# Patient Record
Sex: Female | Born: 1935 | Race: White | Hispanic: No | Smoking: Never smoker
Health system: Southern US, Community
[De-identification: ages and names within clinical notes are randomized; demographics above are authoritative.]

## PROBLEM LIST (undated history)

## (undated) DIAGNOSIS — Z9289 Personal history of other medical treatment: Secondary | ICD-10-CM

## (undated) DIAGNOSIS — F419 Anxiety disorder, unspecified: Secondary | ICD-10-CM

## (undated) DIAGNOSIS — I10 Essential (primary) hypertension: Secondary | ICD-10-CM

## (undated) HISTORY — DX: Personal history of other medical treatment: Z92.89

## (undated) HISTORY — DX: Anxiety disorder, unspecified: F41.9

## (undated) HISTORY — PX: CHOLECYSTECTOMY: SHX55

---

## 2015-04-18 DIAGNOSIS — Z23 Encounter for immunization: Secondary | ICD-10-CM | POA: Diagnosis not present

## 2015-09-20 DIAGNOSIS — B029 Zoster without complications: Secondary | ICD-10-CM | POA: Diagnosis not present

## 2015-12-05 ENCOUNTER — Emergency Department: Payer: Medicare Other

## 2015-12-05 ENCOUNTER — Emergency Department
Admission: EM | Admit: 2015-12-05 | Discharge: 2015-12-05 | Disposition: A | Discharge status: Home / Self Care | Payer: Medicare Other | Attending: Emergency Medicine | Admitting: Emergency Medicine

## 2015-12-05 ENCOUNTER — Encounter: Payer: Self-pay | Admitting: Emergency Medicine

## 2015-12-05 DIAGNOSIS — M25511 Pain in right shoulder: Secondary | ICD-10-CM | POA: Diagnosis not present

## 2015-12-05 DIAGNOSIS — M79601 Pain in right arm: Secondary | ICD-10-CM | POA: Diagnosis not present

## 2015-12-05 DIAGNOSIS — Z791 Long term (current) use of non-steroidal anti-inflammatories (NSAID): Secondary | ICD-10-CM | POA: Diagnosis not present

## 2015-12-05 DIAGNOSIS — Z7982 Long term (current) use of aspirin: Secondary | ICD-10-CM | POA: Insufficient documentation

## 2015-12-05 DIAGNOSIS — R9431 Abnormal electrocardiogram [ECG] [EKG]: Secondary | ICD-10-CM | POA: Diagnosis not present

## 2015-12-05 DIAGNOSIS — M79602 Pain in left arm: Secondary | ICD-10-CM | POA: Diagnosis not present

## 2015-12-05 DIAGNOSIS — M25512 Pain in left shoulder: Secondary | ICD-10-CM | POA: Diagnosis not present

## 2015-12-05 DIAGNOSIS — R6889 Other general symptoms and signs: Secondary | ICD-10-CM | POA: Diagnosis not present

## 2015-12-05 LAB — CBC
HEMATOCRIT: 41.1 % (ref 35.0–47.0)
HEMOGLOBIN: 14 g/dL (ref 12.0–16.0)
MCH: 32.2 pg (ref 26.0–34.0)
MCHC: 34.2 g/dL (ref 32.0–36.0)
MCV: 94.3 fL (ref 80.0–100.0)
Platelets: 291 10*3/uL (ref 150–440)
RBC: 4.36 MIL/uL (ref 3.80–5.20)
RDW: 14.1 % (ref 11.5–14.5)
WBC: 9.7 10*3/uL (ref 3.6–11.0)

## 2015-12-05 LAB — BASIC METABOLIC PANEL
ANION GAP: 6 (ref 5–15)
BUN: 15 mg/dL (ref 6–20)
CALCIUM: 8.9 mg/dL (ref 8.9–10.3)
CO2: 26 mmol/L (ref 22–32)
Chloride: 109 mmol/L (ref 101–111)
Creatinine, Ser: 1.05 mg/dL — ABNORMAL HIGH (ref 0.44–1.00)
GFR calc Af Amer: 55 mL/min — ABNORMAL LOW (ref >60)
GFR, EST NON AFRICAN AMERICAN: 48 mL/min — AB (ref >60)
Glucose, Bld: 96 mg/dL (ref 65–99)
POTASSIUM: 3.7 mmol/L (ref 3.5–5.1)
Sodium: 141 mmol/L (ref 135–145)

## 2015-12-05 LAB — TROPONIN I

## 2015-12-05 NOTE — Discharge Instructions (Signed)
Shoulder Pain The shoulder is the joint that connects your arms to your body. The bones that form the shoulder joint include the upper arm bone (humerus), the shoulder blade (scapula), and the collarbone (clavicle). The top of the humerus is shaped like a ball and fits into a rather flat socket on the scapula (glenoid cavity). A combination of muscles and strong, fibrous tissues that connect muscles to bones (tendons) support your shoulder joint and hold the ball in the socket. Small, fluid-filled sacs (bursae) are located in different areas of the joint. They act as cushions between the bones and the overlying soft tissues and help reduce friction between the gliding tendons and the bone as you move your arm. Your shoulder joint allows a wide range of motion in your arm. This range of motion allows you to do things like scratch your back or throw a ball. However, this range of motion also makes your shoulder more prone to pain from overuse and injury. Causes of shoulder pain can originate from both injury and overuse and usually can be grouped in the following four categories:  Redness, swelling, and pain (inflammation) of the tendon (tendinitis) or the bursae (bursitis).  Instability, such as a dislocation of the joint.  Inflammation of the joint (arthritis).  Broken bone (fracture). HOME CARE INSTRUCTIONS   Apply ice to the sore area.  Put ice in a plastic bag.  Place a towel between your skin and the bag.  Leave the ice on for 15-20 minutes, 3-4 times per day for the first 2 days, or as directed by your health care provider.  Stop using cold packs if they do not help with the pain.  If you have a shoulder sling or immobilizer, wear it as long as your caregiver instructs. Only remove it to shower or bathe. Move your arm as little as possible, but keep your hand moving to prevent swelling.  Squeeze a soft ball or foam pad as much as possible to help prevent swelling.  Only take  over-the-counter or prescription medicines for pain, discomfort, or fever as directed by your caregiver. SEEK MEDICAL CARE IF:   Your shoulder pain increases, or new pain develops in your arm, hand, or fingers.  Your hand or fingers become cold and numb.  Your pain is not relieved with medicines. SEEK IMMEDIATE MEDICAL CARE IF:   Your arm, hand, or fingers are numb or tingling.  Your arm, hand, or fingers are significantly swollen or turn white or blue. MAKE SURE YOU:   Understand these instructions.  Will watch your condition.  Will get help right away if you are not doing well or get worse.   This information is not intended to replace advice given to you by your health care provider. Make sure you discuss any questions you have with your health care provider.   Document Released: 02/27/2005 Document Revised: 06/10/2014 Document Reviewed: 09/12/2014 Elsevier Interactive Patient Education 2016 [employer]  Pain Without a Known Cause WHAT IS PAIN WITHOUT A KNOWN CAUSE? Pain can occur in any part of the body and can range from mild to severe. Sometimes no cause can be found for why you are having pain. Some types of pain that can occur without a known cause include:   Headache.  Back pain.  Abdominal pain.  Neck pain. HOW IS PAIN WITHOUT A KNOWN CAUSE DIAGNOSED?  Your health care provider will try to find the cause of your pain. This may include:  Physical exam.  Medical  history.  Blood tests.  Urine tests.  X-rays. If no cause is found, your health care provider may diagnose you with pain without a known cause.  IS THERE TREATMENT FOR PAIN WITHOUT A CAUSE?  Treatment depends on the kind of pain you have. Your health care provider may prescribe medicines to help relieve your pain.  WHAT CAN I DO AT HOME FOR MY PAIN?   Take medicines only as directed by your health care provider.  Stop any activities that cause pain. During periods of severe pain, bed rest may  help.  Try to reduce your stress with activities such as yoga or meditation. Talk to your health care provider for other stress-reducing activity recommendations.  Exercise regularly, if approved by your health care provider.  Eat a healthy diet that includes fruits and vegetables. This may improve pain. Talk to your health care provider if you have any questions about your diet. WHAT IF MY PAIN DOES NOT GET BETTER?  If you have a painful condition and no reason can be found for the pain or the pain gets worse, it is important to follow up with your health care provider. It may be necessary to repeat tests and look further for a possible cause.    This information is not intended to replace advice given to you by your health care provider. Make sure you discuss any questions you have with your health care provider.   Document Released: 02/12/2001 Document Revised: 06/10/2014 Document Reviewed: 10/05/2013 Elsevier Interactive Patient Education 2016 [employer]  Joint Pain Joint pain can be caused by many things. The joint can be bruised, infected, weak from aging, or sore from exercise. The pain will probably go away if you follow your doctor's instructions for home care. If your joint pain continues, more tests may be needed to help find the cause of your condition. HOME CARE Watch your condition for any changes. Follow these instructions as told to lessen the pain that you are feeling:  Take medicines only as told by your doctor.  Rest the sore joint for as long as told by your doctor. If your doctor tells you to, raise (elevate) the painful joint above the level of your heart while you are sitting or lying down.  Do not do things that cause pain or make the pain worse.  If told, put ice on the painful area:  Put ice in a plastic bag.  Place a towel between your skin and the bag.  Leave the ice on for 20 minutes, 2-3 times per day.  Wear an elastic bandage, splint, or sling as  told by your doctor. Loosen the bandage or splint if your fingers or toes lose feeling (become numb) and tingle, or if they turn cold and blue.  Begin exercising or stretching the joint as told by your doctor. Ask your doctor what types of exercise are safe for you.  Keep all follow-up visits as told by your doctor. This is important. GET HELP IF:  Your pain gets worse and medicine does not help it.  Your joint pain does not get better in 3 days.  You have more bruising or swelling.  You have a fever.  You lose 10 pounds (4.5 kg) or more without trying. GET HELP RIGHT AWAY IF:  You are not able to move the joint.  Your fingers or toes become numb or they turn cold and blue.   This information is not intended to replace advice given to you by  your health care provider. Make sure you discuss any questions you have with your health care provider.   Document Released: 05/08/2009 Document Revised: 06/10/2014 Document Reviewed: 03/01/2014 Elsevier Interactive Patient Education 2016 [employer] Arthritis Arthritis is a term that is commonly used to refer to joint pain or joint disease. There are more than 100 types of arthritis. CAUSES The most common cause of this condition is wear and tear of a joint. Other causes include:  Gout.  Inflammation of a joint.  An infection of a joint.  Sprains and other injuries near the joint.  A drug reaction or allergic reaction. In some cases, the cause may not be known. SYMPTOMS The main symptom of this condition is pain in the joint with movement. Other symptoms include:  Redness, swelling, or stiffness at a joint.  Warmth coming from the joint.  Fever.  Overall feeling of illness. DIAGNOSIS This condition may be diagnosed with a physical exam and tests, including:  Blood tests.  Urine tests.  Imaging tests, such as MRI, X-rays, or a CT scan. Sometimes, fluid is removed from a joint for testing. TREATMENT Treatment for  this condition may involve:  Treatment of the cause, if it is known.  Rest.  Raising (elevating) the joint.  Applying cold or hot packs to the joint.  Medicines to improve symptoms and reduce inflammation.  Injections of a steroid such as cortisone into the joint to help reduce pain and inflammation. Depending on the cause of your arthritis, you may need to make lifestyle changes to reduce stress on your joint. These changes may include exercising more and losing weight. HOME CARE INSTRUCTIONS Medicines  Take over-the-counter and prescription medicines only as told by your health care provider.  Do not take aspirin to relieve pain if gout is suspected. Activities  Rest your joint if told by your health care provider. Rest is important when your disease is active and your joint feels painful, swollen, or stiff.  Avoid activities that make the pain worse. It is important to balance activity with rest.  Exercise your joint regularly with range-of-motion exercises as told by your health care provider. Try doing low-impact exercise, such as:  Swimming.  Water aerobics.  Biking.  Walking. Joint Care  If your joint is swollen, keep it elevated if told by your health care provider.  If your joint feels stiff in the morning, try taking a warm shower.  If directed, apply heat to the joint. If you have diabetes, do not apply heat without permission from your health care provider.  Put a towel between the joint and the hot pack or heating pad.  Leave the heat on the area for 20-30 minutes.  If directed, apply ice to the joint:  Put ice in a plastic bag.  Place a towel between your skin and the bag.  Leave the ice on for 20 minutes, 2-3 times per day.  Keep all follow-up visits as told by your health care provider. This is important. SEEK MEDICAL CARE IF:  The pain gets worse.  You have a fever. SEEK IMMEDIATE MEDICAL CARE IF:  You develop severe joint pain,  swelling, or redness.  Many joints become painful and swollen.  You develop severe back pain.  You develop severe weakness in your leg.  You cannot control your bladder or bowels.   This information is not intended to replace advice given to you by your health care provider. Make sure you discuss any questions you have with your  health care provider.   Document Released: 06/27/2004 Document Revised: 02/08/2015 Document Reviewed: 08/15/2014 Elsevier Interactive Patient Education [employer]

## 2015-12-05 NOTE — ED Notes (Signed)
Pt states bilateral upper arm pain that began at 0230. Pt states over last 2-3 days has had episodes of bilateral upper arm pain that improves with asa and movement. Pt denies shob, dizziness, nausea, fever, cough, diaphoresis. Pt denies pain radiation to neck, back, jaw. Pt states did have pain radiation to left posterior shoulder with episode this am. Pt took 2-"large aspirin" prior to calling ems. Skin pwd. resps unlabored.

## 2015-12-05 NOTE — ED Provider Notes (Signed)
[facility] Emergency Department Provider Note   ____________________________________________  Time seen: Approximately 547 AM  I have reviewed the triage vital signs and the nursing notes.   HISTORY  Chief Complaint Arm Pain    HPI Claudia Friedman is a 82 y.o. female comes into the hospital today with aching in her upper arms bilaterally. She reports that she had the episodes a few times last week but thought it was due to her excessive yardwork. She reports that she woke up and felt it tonight. She took some aspirin and it went away but she was concerned that she kept having the symptoms that she decided to come into the hospital for evaluation. She reports that she woke up around 2 AM and realized that she had the pain but she does not know if it woke her up. The patient reports that her pain is a 0 out of 10 currently. She denies any other strenuous activities.The patient denies any chest pain denies shortness of breath denies dizziness denies lightheadedness and sweats. She's never had this pain before last week. The patient reports that she has not seen a primary care physician in multiple years. The patient is here for evaluation.   History reviewed. No pertinent past medical history.  There are no active problems to display for this patient.   Past Surgical History  Procedure Laterality Date  . Cholecystectomy      No current outpatient prescriptions  Allergies Review of patient's allergies indicates no known allergies.  History reviewed. No pertinent family history.  Social History Social History  Substance Use Topics  . Smoking status: Never Smoker   . Smokeless tobacco: Never Used  . Alcohol Use: Yes    Review of Systems Constitutional: No fever/chills Eyes: No visual changes. ENT: No sore throat. Cardiovascular: Denies chest pain. Respiratory: Denies shortness of breath. Gastrointestinal: No abdominal pain.  No nausea, no  vomiting.  No diarrhea.  No constipation. Genitourinary: Negative for dysuria. Musculoskeletal: Bilateral shoulder pain Skin: Negative for rash. Neurological: Negative for headaches, focal weakness or numbness.  10-point ROS otherwise negative.  ____________________________________________   PHYSICAL EXAM:  VITAL SIGNS: ED Triage Vitals  Enc Vitals Group     BP 12/05/15 0506 130/107 mmHg     Pulse Rate 12/05/15 0506 61     Resp 12/05/15 0506 16     Temp 12/05/15 0506 98.1 F (36.7 C)     Temp Source 12/05/15 0506 Oral     SpO2 12/05/15 0506 100 %     Weight 12/05/15 0506 193 lb (87.544 kg)     Height 12/05/15 0506 5\' 6"  (1.676 m)     Head Cir --      Peak Flow --      Pain Score 12/05/15 0506 6     Pain Loc --      Pain Edu? --      Excl. in GC? --     Constitutional: Alert and oriented. Well appearing and in no distress. Eyes: Conjunctivae are normal. PERRL. EOMI. Head: Atraumatic. Nose: No congestion/rhinnorhea. Mouth/Throat: Mucous membranes are moist.  Oropharynx non-erythematous. Cardiovascular: Normal rate, regular rhythm. Grade II/VI systolic murmur.  Good peripheral circulation. Respiratory: Normal respiratory effort.  No retractions. Lungs CTAB. Gastrointestinal: Soft and nontender. No distention. Positive bowel sounds Musculoskeletal: No lower extremity tenderness nor edema. . Neurologic:  Normal speech and language.  Skin:  Skin is warm, dry and intact.  Psychiatric: Mood and affect are normal.   ____________________________________________  LABS (all labs ordered are listed, but only abnormal results are displayed)  Labs Reviewed  BASIC METABOLIC PANEL - Abnormal; Notable for the following:    Creatinine, Ser 1.05 (*)    GFR calc non Af Amer 48 (*)    GFR calc Af Amer 55 (*)    All other components within normal limits  CBC  TROPONIN I  TROPONIN I   ____________________________________________  EKG  ED ECG REPORT I, Rebecka ApleyWebster,  Dailan Pfalzgraf P,  the attending physician, personally viewed and interpreted this ECG.   Date: 12/05/2015  EKG Time: 501  Rate: 68  Rhythm: normal sinus rhythm  Axis: normal  Intervals:none  ST&T Change: Flipped T waves in leads 3 and aVF, V2 and V3.  ____________________________________________  RADIOLOGY  CXR: No active cardiopulmonary disease, aortic atherosclerosis. ____________________________________________   PROCEDURES  Procedure(s) performed: None  Procedures  Critical Care performed: No  ____________________________________________   INITIAL IMPRESSION / ASSESSMENT AND PLAN / ED COURSE  Pertinent labs & imaging results that were available during my care of the patient were reviewed by me and considered in my medical decision making (see chart for details).  This is an 82 year old female who comes into the hospital today with some bilateral shoulder pain. The patient reports it is been coming and going for the past week so she is unsure what going on. The patient does have some flipped T waves on her EKG but she has not seen a doctor in multiple years. The patient's initial troponin is negative. I'll repeat the troponin at 3 hours as well as perform a chest x-ray.  The patient's care will be signed out to Dr. Mayford KnifeWilliams who will follow up the results of the repeat troponin  ____________________________________________   FINAL CLINICAL IMPRESSION(S) / ED DIAGNOSES  Final diagnoses:  Bilateral shoulder pain      NEW MEDICATIONS STARTED DURING THIS VISIT:  New Prescriptions   No medications on file     Note:  This document was prepared using Dragon voice recognition software and may include unintentional dictation errors.    Rebecka ApleyAllison P Teira Arcilla, MD 12/05/15 [phone]

## 2015-12-18 ENCOUNTER — Encounter: Payer: Self-pay | Admitting: Emergency Medicine

## 2015-12-18 ENCOUNTER — Emergency Department: Payer: Medicare Other

## 2015-12-18 ENCOUNTER — Observation Stay
Admission: EM | Admit: 2015-12-18 | Discharge: 2015-12-19 | Disposition: A | Discharge status: Home / Self Care | Payer: Medicare Other | Attending: Internal Medicine | Admitting: Internal Medicine

## 2015-12-18 DIAGNOSIS — R079 Chest pain, unspecified: Principal | ICD-10-CM | POA: Diagnosis present

## 2015-12-18 DIAGNOSIS — Z8249 Family history of ischemic heart disease and other diseases of the circulatory system: Secondary | ICD-10-CM | POA: Insufficient documentation

## 2015-12-18 DIAGNOSIS — Z7982 Long term (current) use of aspirin: Secondary | ICD-10-CM | POA: Diagnosis not present

## 2015-12-18 DIAGNOSIS — M549 Dorsalgia, unspecified: Secondary | ICD-10-CM | POA: Diagnosis not present

## 2015-12-18 DIAGNOSIS — Z7901 Long term (current) use of anticoagulants: Secondary | ICD-10-CM | POA: Insufficient documentation

## 2015-12-18 DIAGNOSIS — E669 Obesity, unspecified: Secondary | ICD-10-CM | POA: Insufficient documentation

## 2015-12-18 DIAGNOSIS — I700 Atherosclerosis of aorta: Secondary | ICD-10-CM | POA: Insufficient documentation

## 2015-12-18 DIAGNOSIS — I517 Cardiomegaly: Secondary | ICD-10-CM | POA: Diagnosis not present

## 2015-12-18 DIAGNOSIS — Z8262 Family history of osteoporosis: Secondary | ICD-10-CM | POA: Insufficient documentation

## 2015-12-18 DIAGNOSIS — R5381 Other malaise: Secondary | ICD-10-CM | POA: Diagnosis not present

## 2015-12-18 DIAGNOSIS — Z9049 Acquired absence of other specified parts of digestive tract: Secondary | ICD-10-CM | POA: Insufficient documentation

## 2015-12-18 DIAGNOSIS — M25511 Pain in right shoulder: Secondary | ICD-10-CM | POA: Diagnosis not present

## 2015-12-18 DIAGNOSIS — I119 Hypertensive heart disease without heart failure: Secondary | ICD-10-CM | POA: Insufficient documentation

## 2015-12-18 DIAGNOSIS — M79602 Pain in left arm: Secondary | ICD-10-CM | POA: Diagnosis not present

## 2015-12-18 DIAGNOSIS — I10 Essential (primary) hypertension: Secondary | ICD-10-CM | POA: Diagnosis present

## 2015-12-18 DIAGNOSIS — Z6831 Body mass index (BMI) 31.0-31.9, adult: Secondary | ICD-10-CM | POA: Diagnosis not present

## 2015-12-18 DIAGNOSIS — I082 Rheumatic disorders of both aortic and tricuspid valves: Secondary | ICD-10-CM | POA: Diagnosis not present

## 2015-12-18 DIAGNOSIS — M79601 Pain in right arm: Secondary | ICD-10-CM | POA: Insufficient documentation

## 2015-12-18 DIAGNOSIS — I2510 Atherosclerotic heart disease of native coronary artery without angina pectoris: Secondary | ICD-10-CM | POA: Insufficient documentation

## 2015-12-18 DIAGNOSIS — R5383 Other fatigue: Secondary | ICD-10-CM | POA: Insufficient documentation

## 2015-12-18 DIAGNOSIS — F419 Anxiety disorder, unspecified: Secondary | ICD-10-CM | POA: Diagnosis present

## 2015-12-18 DIAGNOSIS — R0789 Other chest pain: Secondary | ICD-10-CM | POA: Diagnosis not present

## 2015-12-18 HISTORY — DX: Essential (primary) hypertension: I10

## 2015-12-18 LAB — BASIC METABOLIC PANEL
Anion gap: 8 (ref 5–15)
BUN: 16 mg/dL (ref 6–20)
CO2: 23 mmol/L (ref 22–32)
CREATININE: 1.07 mg/dL — AB (ref 0.44–1.00)
Calcium: 8.6 mg/dL — ABNORMAL LOW (ref 8.9–10.3)
Chloride: 105 mmol/L (ref 101–111)
GFR calc Af Amer: 54 mL/min — ABNORMAL LOW (ref >60)
GFR, EST NON AFRICAN AMERICAN: 47 mL/min — AB (ref >60)
Glucose, Bld: 103 mg/dL — ABNORMAL HIGH (ref 65–99)
POTASSIUM: 3.9 mmol/L (ref 3.5–5.1)
SODIUM: 136 mmol/L (ref 135–145)

## 2015-12-18 LAB — CBC
HEMATOCRIT: 41.2 % (ref 35.0–47.0)
Hemoglobin: 13.8 g/dL (ref 12.0–16.0)
MCH: 31.4 pg (ref 26.0–34.0)
MCHC: 33.4 g/dL (ref 32.0–36.0)
MCV: 94 fL (ref 80.0–100.0)
PLATELETS: 285 10*3/uL (ref 150–440)
RBC: 4.39 MIL/uL (ref 3.80–5.20)
RDW: 13.9 % (ref 11.5–14.5)
WBC: 9.4 10*3/uL (ref 3.6–11.0)

## 2015-12-18 LAB — TROPONIN I: Troponin I: <0.03 ng/mL (ref <0.03)

## 2015-12-18 MED ORDER — NITROGLYCERIN 2 % TD OINT
0.5000 [in_us] | TOPICAL_OINTMENT | Freq: Four times a day (QID) | TRANSDERMAL | Status: DC
  Administered 2015-12-18 – 2015-12-19 (×2): 0.5 [in_us] via TOPICAL
  Filled 2015-12-18 (×2): qty 1

## 2015-12-18 MED ORDER — IOPAMIDOL (ISOVUE-370) INJECTION 76%
75.0000 mL | Freq: Once | INTRAVENOUS | Status: AC | PRN
  Administered 2015-12-18: 75 mL via INTRAVENOUS

## 2015-12-18 NOTE — ED Notes (Signed)
Pt ambulated to bathroom at this time independently with no concerns. Pt tolerated well, NAD noted at this time. Will continue to monitor.

## 2015-12-18 NOTE — ED Notes (Addendum)
Pt reports she had taken 4 aspirin today to help relieve chest pain. MD aware.

## 2015-12-18 NOTE — H&P (Signed)
[facility] [place] at [facility]   PATIENT NAME: Claudia Friedman Turan    MR#:  [id number]  DATE OF BIRTH:  08/03/1930  DATE OF ADMISSION:  12/18/2015  PRIMARY CARE PHYSICIAN: No PCP Per Patient   REQUESTING/REFERRING PHYSICIAN: Fanny BienQuale, MD  CHIEF COMPLAINT:   Chief Complaint  Patient presents with  . Chest Pain  . Back Pain    HISTORY OF PRESENT ILLNESS:  Claudia Friedman Gawthrop  is a 82 y.o. female who presents with An episode of chest tightness. Patient states that she was seen here couple weeks ago she had bilateral upper arm and shoulder pain. She had a cardiac workup at that time in the ED which was negative. Today she states her pain was located in her central chest and also in her upper central back. It was a chest tightness that was not alleviated when she took full strength aspirin at home. He was alleviated when she got nitroglycerin here. First cardiac enzyme was negative, EKG was largely unremarkable for ischemia, and unchanged since her EKG 2 weeks ago. However, given her risk hospitalists were called for observation and further evaluation.Marland Kitchen.  PAST MEDICAL HISTORY:   Past Medical History  Diagnosis Date  . Hypertension     PAST SURGICAL HISTORY:   Past Surgical History  Procedure Laterality Date  . Cholecystectomy      SOCIAL HISTORY:   Social History  Substance Use Topics  . Smoking status: Never Smoker   . Smokeless tobacco: Never Used  . Alcohol Use: Yes     Comment: occasional     FAMILY HISTORY:   Family History  Problem Relation Age of Onset  . CAD    . Osteoporosis      DRUG ALLERGIES:  No Known Allergies  MEDICATIONS AT HOME:   Prior to Admission medications   Medication Sig Start Date End Date Taking? Authorizing Provider  aspirin EC 325 MG tablet Take 325 mg by mouth daily as needed for mild pain or moderate pain.   Yes Historical Provider, MD  naproxen sodium (RA NAPROXEN SODIUM) 220 MG tablet Take 220-440 mg by mouth every  6 (six) hours as needed. For pain and sleep.   Yes Historical Provider, MD    REVIEW OF SYSTEMS:  Review of Systems  Constitutional: Negative for fever, chills, weight loss and malaise/fatigue.  HENT: Negative for ear pain, hearing loss and tinnitus.   Eyes: Negative for blurred vision, double vision, pain and redness.  Respiratory: Negative for cough, hemoptysis and shortness of breath.   Cardiovascular: Positive for chest pain. Negative for palpitations, orthopnea and leg swelling.  Gastrointestinal: Negative for nausea, vomiting, abdominal pain, diarrhea and constipation.  Genitourinary: Negative for dysuria, frequency and hematuria.  Musculoskeletal: Positive for back pain. Negative for joint pain and neck pain.  Skin:       No acne, rash, or lesions  Neurological: Negative for dizziness, tremors, focal weakness and weakness.  Endo/Heme/Allergies: Negative for polydipsia. Does not bruise/bleed easily.  Psychiatric/Behavioral: Negative for depression. The patient is not nervous/anxious and does not have insomnia.      VITAL SIGNS:   Filed Vitals:   12/18/15 1944 12/18/15 2308  BP: 187/89 169/60  Pulse: 77 68  Temp: 97.7 F (36.5 C)   TempSrc: Oral   Resp: 18 16  Height: 5\' 6"  (1.676 m)   Weight: 87.091 kg (192 lb)   SpO2: 97% 96%   Wt Readings from Last 3 Encounters:  12/18/15 87.091 kg (192 lb)  12/05/15 87.544 kg (193 lb)    PHYSICAL EXAMINATION:  Physical Exam  Vitals reviewed. Constitutional: She is oriented to person, place, and time. She appears well-developed and well-nourished. No distress.  HENT:  Head: Normocephalic and atraumatic.  Mouth/Throat: Oropharynx is clear and moist.  Eyes: Conjunctivae and EOM are normal. Pupils are equal, round, and reactive to light. No scleral icterus.  Neck: Normal range of motion. Neck supple. No JVD present. No thyromegaly present.  Cardiovascular: Normal rate, regular rhythm and intact distal pulses.  Exam reveals no  gallop and no friction rub.   No murmur heard. Respiratory: Effort normal and breath sounds normal. No respiratory distress. She has no wheezes. She has no rales.  GI: Soft. Bowel sounds are normal. She exhibits no distension. There is no tenderness.  Musculoskeletal: Normal range of motion. She exhibits no edema.  No arthritis, no gout  Lymphadenopathy:    She has no cervical adenopathy.  Neurological: She is alert and oriented to person, place, and time. No cranial nerve deficit.  No dysarthria, no aphasia  Skin: Skin is warm and dry. No rash noted. No erythema.  Psychiatric: She has a normal mood and affect. Her behavior is normal. Judgment and thought content normal.    LABORATORY PANEL:   CBC  Recent Labs Lab 12/18/15 1952  WBC 9.4  HGB 13.8  HCT 41.2  PLT 285   ------------------------------------------------------------------------------------------------------------------  Chemistries   Recent Labs Lab 12/18/15 1952  NA 136  K 3.9  CL 105  CO2 23  GLUCOSE 103*  BUN 16  CREATININE 1.07*  CALCIUM 8.6*   ------------------------------------------------------------------------------------------------------------------  Cardiac Enzymes  Recent Labs Lab 12/18/15 1952  TROPONINI <0.03   ------------------------------------------------------------------------------------------------------------------  RADIOLOGY:  Dg Chest 2 View  12/18/2015  CLINICAL DATA:  Chest tightness and lower back pain today. EXAM: CHEST  2 VIEW COMPARISON:  Chest x-ray dated 12/05/2015. FINDINGS: Heart size is upper normal, stable. Overall cardiomediastinal silhouette is stable in size and configuration. Mild atherosclerotic changes noted at the aortic arch. Age-related aortic ectasia appears stable. Lungs are clear. No pleural effusion or pneumothorax seen. Mild degenerative spurring again noted within the slightly kyphotic thoracic spine. IMPRESSION: No active cardiopulmonary disease.   No evidence of pneumonia. Aortic atherosclerosis. Electronically Signed   By: Bary Richard M.D.   On: 12/18/2015 20:07   Ct Angio Chest Aorta W/cm &/or Wo/cm  12/18/2015  CLINICAL DATA:  Back and shoulder pain for 2 weeks. Evaluate for dissection. EXAM: CT ANGIOGRAPHY CHEST WITH CONTRAST TECHNIQUE: Multidetector CT imaging of the chest was performed using the standard protocol during bolus administration of intravenous contrast. Multiplanar CT image reconstructions and MIPs were obtained to evaluate the vascular anatomy. CONTRAST:  75 cc Isovue 370 intravenous COMPARISON:  None. FINDINGS: Cardiovascular: Mild cardiomegaly. No pericardial effusion. Atherosclerosis, including the coronary arteries. No evidence of intramural hematoma, dissection, or rupture of the aorta. Ductus bump is noted. The aorta is tortuous without fusiform aneurysm. Opacified pulmonary arteries negative for filling defect. Mediastinum:  Negative for adenopathy. Lungs/Pleura: There is no edema, consolidation, effusion, or pneumothorax. Mild dependent atelectasis or scar. Upper abdomen: Cholecystectomy. Renal sinus cysts on the left and cortical cysts on the right. Musculoskeletal: Degenerative changes without acute or aggressive finding. Review of the MIP images confirms the above findings. IMPRESSION: No evidence of acute aortic syndrome or other acute process. Electronically Signed   By: Marnee Spring M.D.   On: 12/18/2015 22:40    EKG:   Orders placed or performed during  the hospital encounter of 12/18/15  . ED EKG within 10 minutes  . ED EKG within 10 minutes    IMPRESSION AND PLAN:  Principal Problem:   Chest pain - first enzyme negative, EKG without ischemic changes. We will admit her, trend her cardiac enzymes, get an echocardiogram and cardiology consult. Active Problems:   HTN (hypertension) - stable, no current home meds listed for this, goal is less than 160/100, treat as needed  All the records are reviewed and  case discussed with ED provider. Management plans discussed with the patient and/or family.  DVT PROPHYLAXIS: SubQ lovenox  GI PROPHYLAXIS: None  ADMISSION STATUS: Observation  CODE STATUS: Full Code Status History    This patient does not have a recorded code status. Please follow your organizational policy for patients in this situation.      TOTAL TIME TAKING CARE OF THIS PATIENT: 40 minutes.    Taylen Wendland FIELDING 12/18/2015, 11:14 PM  [employer] [facility]  Office  [phone]  CC: Primary care physician; No PCP Per Patient

## 2015-12-18 NOTE — ED Provider Notes (Signed)
[facility] Emergency Department Provider Note ____________________________________________  Time seen: Approximately 9:51 PM  I have reviewed the triage vital signs and the nursing notes.   HISTORY  Chief Complaint Chest Pain and Back Pain   HPI Claudia Friedman is a 82 y.o. female with no significant medical history, but also tells me she has not seen her regular doctor about 12 years.  Patient reports about 2 weeks ago she began noticing pain in her back and across her shoulders, she was seen in the ER and diagnosis probable arthritis. She's been taking Aleve for that. Today she was walking to the garbage can, and while walking noticed a sense of tightness or pressure along her left side of her chest. She also reports that this radiates somewhat into her left arm briefly.  She reports her pain is better now and she has just a 1 out of 10 sense of slight discomfort in the left chest. She reports it better though. Pain started around midday today.  She denies a history of heart disease. She's never smoked. She is not aware of any medical issues that she has, but has not seen a doctor in a very long time.  Patient took 4 full strength aspirin throughout the course the day today because she is worried this may be from her heart.  Past Medical History  Diagnosis Date  . Hypertension     Patient Active Problem List   Diagnosis Date Noted  . Chest pain 12/18/2015  . HTN (hypertension) 12/18/2015    Past Surgical History  Procedure Laterality Date  . Cholecystectomy      Current Outpatient Rx  Name  Route  Sig  Dispense  Refill  . aspirin EC 325 MG tablet   Oral   Take 325 mg by mouth daily as needed for mild pain or moderate pain.         . naproxen sodium (RA NAPROXEN SODIUM) 220 MG tablet   Oral   Take 220-440 mg by mouth every 6 (six) hours as needed. For pain and sleep.           Allergies Review of patient's allergies indicates no known  allergies.  Family History  Problem Relation Age of Onset  . CAD    . Osteoporosis      Social History Social History  Substance Use Topics  . Smoking status: Never Smoker   . Smokeless tobacco: Never Used  . Alcohol Use: Yes     Comment: occasional     Review of Systems Constitutional: No fever/chills Eyes: No visual changes. ENT: No sore throat. Cardiovascular: See history of present illness Respiratory: Denies shortness of breath. Gastrointestinal: No abdominal pain.  No nausea, no vomiting.  No diarrhea.  No constipation. Genitourinary: Negative for dysuria. Musculoskeletal: Negative for back pain. Skin: Negative for rash. Neurological: Negative for headaches, focal weakness or numbness.  10-point ROS otherwise negative.  ____________________________________________   PHYSICAL EXAM:  VITAL SIGNS: ED Triage Vitals  Enc Vitals Group     BP 12/18/15 1944 187/89 mmHg     Pulse Rate 12/18/15 1944 77     Resp 12/18/15 1944 18     Temp 12/18/15 1944 97.7 F (36.5 C)     Temp Source 12/18/15 1944 Oral     SpO2 12/18/15 1944 97 %     Weight 12/18/15 1944 192 lb (87.091 kg)     Height 12/18/15 1944  (1.676 m)     Head Cir --  Peak Flow --      Pain Score 12/18/15 1944 0     Pain Loc --      Pain Edu? --      Excl. in GC? --    Constitutional: Alert and oriented. Well appearing and in no acute distress. Eyes: Conjunctivae are normal. PERRL. EOMI. Head: Atraumatic. Nose: No congestion/rhinnorhea. Mouth/Throat: Mucous membranes are moist.  Oropharynx non-erythematous. Neck: No stridor.   Cardiovascular: Normal rate, regular rhythm. Grossly normal heart sounds.  Good peripheral circulation. Respiratory: Normal respiratory effort.  No retractions. Lungs CTAB. Gastrointestinal: Soft and nontender. No distention. No abdominal bruits. No CVA tenderness. Musculoskeletal: No lower extremity tenderness nor edema.  No joint effusions. Neurologic:  Normal speech  and language. No gross focal neurologic deficits are appreciated. No gait instability. Skin:  Skin is warm, dry and intact. No rash noted. Psychiatric: Mood and affect are normal. Speech and behavior are normal.  ____________________________________________   LABS (all labs ordered are listed, but only abnormal results are displayed)  Labs Reviewed  BASIC METABOLIC PANEL - Abnormal; Notable for the following:    Glucose, Bld 103 (*)    Creatinine, Ser 1.07 (*)    Calcium 8.6 (*)    GFR calc non Af Amer 47 (*)    GFR calc Af Amer 54 (*)    All other components within normal limits  CBC  TROPONIN I   ____________________________________________  EKG  Reviewed and interpreted by me had 20 00 hours Ventricle rate 73 QRS 90 QTc 440 Normal sinus rhythm, mild changes of left ventricular hypertrophy,  T-wave abnormality noted in V1 and V2 that likely represents LVH. No clear evidence of acute coronary syndrome, though inversions noted in lead 3 and also a minimal T-wave abnormality which is nonspecific so the lateral precordial leads. Cannot rule out ischemic change on this ECG alone. ____________________________________________  RADIOLOGY  CT ANGIO CHEST AORTA W/CM &/OR WO/CM (Final result) Result time: 12/18/15 22:40:53   Final result by Rad Results In Interface (12/18/15 22:40:53)   Narrative:   CLINICAL DATA: Back and shoulder pain for 2 weeks. Evaluate for dissection.  EXAM: CT ANGIOGRAPHY CHEST WITH CONTRAST  TECHNIQUE: Multidetector CT imaging of the chest was performed using the standard protocol during bolus administration of intravenous contrast. Multiplanar CT image reconstructions and MIPs were obtained to evaluate the vascular anatomy.  CONTRAST: 75 cc Isovue 370 intravenous  COMPARISON: None.  FINDINGS: Cardiovascular: Mild cardiomegaly. No pericardial effusion. Atherosclerosis, including the coronary arteries. No evidence of intramural hematoma,  dissection, or rupture of the aorta. Ductus bump is noted. The aorta is tortuous without fusiform aneurysm. Opacified pulmonary arteries negative for filling defect.  Mediastinum: Negative for adenopathy.  Lungs/Pleura: There is no edema, consolidation, effusion, or pneumothorax. Mild dependent atelectasis or scar.  Upper abdomen: Cholecystectomy. Renal sinus cysts on the left and cortical cysts on the right.  Musculoskeletal: Degenerative changes without acute or aggressive finding.  Review of the MIP images confirms the above findings.  IMPRESSION: No evidence of acute aortic syndrome or other acute process.   Electronically Signed By: Marnee Spring M.D. On: 12/18/2015 22:40    ____________________________________________   PROCEDURES  Procedure(s) performed: None  Critical Care performed: No  ____________________________________________   INITIAL IMPRESSION / ASSESSMENT AND PLAN / ED COURSE  Pertinent labs & imaging results that were available during my care of the patient were reviewed by me and considered in my medical decision making (see chart for details).  Patient presents for evaluation  of chest pain. Not completely classic, but given the patient's age and somewhat unclear what her actual risk factors are she does not have close follow-up for evaluation of high blood pressure and high cholesterol, however I suspect she likely has hypertension based on her 12-lead and presentation today. She is felt to be moderate risk for coronary disease.  See no signs of pneumothorax, and her symptoms do not seem to suggest a pulmonary embolus him, no abdominal pain. The pain started in her back a couple of weeks ago, now with pain in the anterior chest I do feel it is worth noting and ruling out dissection with a CT angiography today. In addition if this is negative, based on symptomatology the patient reports, I will admit her for cardiology evaluation and chest pain  observation. This plan was discussed with the patient and she is agreeable.  ----------------------------------------- 10:59 PM on 12/18/2015 -----------------------------------------  Patient continued to rest comfortably. She reports no pain at this time. Discussed her results, and discussed admission for further evaluation and observation with the patient who is agreeable. Given the patient's age, presenting symptoms I think is very reasonable to bring her into the hospital tonight for observation regarding chest discomfort and further workup. ____________________________________________   FINAL CLINICAL IMPRESSION(S) / ED DIAGNOSES  Final diagnoses:  Chest pain with moderate risk for cardiac etiology      Sharyn CreamerMark Quale, MD 12/18/15 2300

## 2015-12-18 NOTE — ED Notes (Signed)
Pt ambulatory to triage with steady gait, pt reports was seen here on July 4th for bilateral upper arms, was dx with arthritis. Pt reports has not been feeling "right since then." Pt reports chest tightness and lower back today. Pt reports took 2 extra strength tylenol and aspirin today without relief. Pt alert and oriented x 4, no increased work in breathing noted. Pt denies shortness of breath, dizziness or weakness. Speech clear, equal bilateral hand grips.

## 2015-12-18 NOTE — ED Notes (Signed)
hospitalist at bedside with pt and pts friend.

## 2015-12-19 ENCOUNTER — Other Ambulatory Visit: Payer: Medicare Other

## 2015-12-19 ENCOUNTER — Telehealth: Payer: Self-pay

## 2015-12-19 ENCOUNTER — Observation Stay (HOSPITAL_BASED_OUTPATIENT_CLINIC_OR_DEPARTMENT_OTHER): Payer: Medicare Other

## 2015-12-19 ENCOUNTER — Observation Stay (HOSPITAL_BASED_OUTPATIENT_CLINIC_OR_DEPARTMENT_OTHER)
Admit: 2015-12-19 | Discharge: 2015-12-19 | Disposition: A | Discharge status: Home / Self Care | Payer: Medicare Other | Attending: Internal Medicine | Admitting: Internal Medicine

## 2015-12-19 DIAGNOSIS — I10 Essential (primary) hypertension: Secondary | ICD-10-CM | POA: Diagnosis present

## 2015-12-19 DIAGNOSIS — R079 Chest pain, unspecified: Secondary | ICD-10-CM

## 2015-12-19 DIAGNOSIS — F419 Anxiety disorder, unspecified: Secondary | ICD-10-CM | POA: Diagnosis present

## 2015-12-19 LAB — ECHOCARDIOGRAM COMPLETE
HEIGHTINCHES: 66 in
Weight: 3072 oz

## 2015-12-19 LAB — NM MYOCAR MULTI W/SPECT W/WALL MOTION / EF
CHL CUP NUCLEAR SDS: 1
CHL CUP STRESS STAGE 1 HR: 56 {beats}/min
CHL CUP STRESS STAGE 2 GRADE: 0 %
CHL CUP STRESS STAGE 2 SPEED: 0 mph
CHL CUP STRESS STAGE 3 GRADE: 0 %
CHL CUP STRESS STAGE 4 HR: 79 {beats}/min
CHL CUP STRESS STAGE 5 GRADE: 0 %
CHL CUP STRESS STAGE 5 SPEED: 0 mph
CHL CUP STRESS STAGE 6 GRADE: 0 %
CHL CUP STRESS STAGE 6 HR: 82 {beats}/min
CHL CUP STRESS STAGE 6 SBP: 118 mmHg
CSEPEW: 1 METS
CSEPHR: 60 %
CSEPPHR: 79 {beats}/min
LV dias vol: 57 mL (ref 46–106)
LVSYSVOL: 17 mL
Percent of predicted max HR: 57 %
Rest HR: 60 {beats}/min
SRS: 4
SSS: 1
Stage 2 HR: 56 {beats}/min
Stage 3 HR: 56 {beats}/min
Stage 3 Speed: 0 mph
Stage 4 Grade: 0 %
Stage 4 Speed: 0 mph
Stage 5 HR: 78 {beats}/min
Stage 6 DBP: 63 mmHg
Stage 6 Speed: 0 mph
TID: 0.66

## 2015-12-19 LAB — BASIC METABOLIC PANEL
Anion gap: 5 (ref 5–15)
BUN: 14 mg/dL (ref 6–20)
CHLORIDE: 110 mmol/L (ref 101–111)
CO2: 25 mmol/L (ref 22–32)
CREATININE: 0.94 mg/dL (ref 0.44–1.00)
Calcium: 8.4 mg/dL — ABNORMAL LOW (ref 8.9–10.3)
GFR calc Af Amer: >60 mL/min (ref >60)
GFR, EST NON AFRICAN AMERICAN: 55 mL/min — AB (ref >60)
GLUCOSE: 95 mg/dL (ref 65–99)
Potassium: 3.7 mmol/L (ref 3.5–5.1)
SODIUM: 140 mmol/L (ref 135–145)

## 2015-12-19 LAB — TROPONIN I: Troponin I: <0.03 ng/mL (ref <0.03)

## 2015-12-19 LAB — LIPID PANEL
CHOL/HDL RATIO: 3.2 ratio
Cholesterol: 185 mg/dL (ref 0–200)
HDL: 57 mg/dL (ref >40)
LDL Cholesterol: 100 mg/dL — ABNORMAL HIGH (ref 0–99)
TRIGLYCERIDES: 138 mg/dL (ref <150)
VLDL: 28 mg/dL (ref 0–40)

## 2015-12-19 LAB — CBC
HEMATOCRIT: 37.9 % (ref 35.0–47.0)
HEMOGLOBIN: 13.1 g/dL (ref 12.0–16.0)
MCH: 31.9 pg (ref 26.0–34.0)
MCHC: 34.5 g/dL (ref 32.0–36.0)
MCV: 92.5 fL (ref 80.0–100.0)
Platelets: 260 10*3/uL (ref 150–440)
RBC: 4.1 MIL/uL (ref 3.80–5.20)
RDW: 13.7 % (ref 11.5–14.5)
WBC: 8.3 10*3/uL (ref 3.6–11.0)

## 2015-12-19 LAB — HEMOGLOBIN A1C: HEMOGLOBIN A1C: 6.1 % — AB (ref 4.0–6.0)

## 2015-12-19 MED ORDER — TECHNETIUM TC 99M TETROFOSMIN IV KIT
13.1700 | PACK | Freq: Once | INTRAVENOUS | Status: AC | PRN
  Administered 2015-12-19: 13.1 via INTRAVENOUS

## 2015-12-19 MED ORDER — ACETAMINOPHEN 650 MG RE SUPP
650.0000 mg | Freq: Four times a day (QID) | RECTAL | Status: DC | PRN
Start: 2015-12-19 — End: 2015-12-19

## 2015-12-19 MED ORDER — SODIUM CHLORIDE 0.9% FLUSH
3.0000 mL | Freq: Two times a day (BID) | INTRAVENOUS | Status: DC
  Administered 2015-12-19 (×2): 3 mL via INTRAVENOUS

## 2015-12-19 MED ORDER — MORPHINE SULFATE (PF) 2 MG/ML IV SOLN: 2.0000 mg | INTRAVENOUS | Status: DC | PRN

## 2015-12-19 MED ORDER — ALPRAZOLAM 0.25 MG PO TABS: 0.2500 mg | ORAL_TABLET | Freq: Two times a day (BID) | ORAL | Status: DC | PRN

## 2015-12-19 MED ORDER — REGADENOSON 0.4 MG/5ML IV SOLN
0.4000 mg | Freq: Once | INTRAVENOUS | Status: AC
  Administered 2015-12-19: 0.4 mg via INTRAVENOUS
  Filled 2015-12-19: qty 5

## 2015-12-19 MED ORDER — ACETAMINOPHEN 325 MG PO TABS
650.0000 mg | ORAL_TABLET | Freq: Four times a day (QID) | ORAL | Status: DC | PRN
  Administered 2015-12-19: 650 mg via ORAL
  Filled 2015-12-19: qty 2

## 2015-12-19 MED ORDER — TECHNETIUM TC 99M TETROFOSMIN IV KIT
31.5500 | PACK | Freq: Once | INTRAVENOUS | Status: AC | PRN
Start: 2015-12-19 — End: 2015-12-19
  Administered 2015-12-19: 31.55 via INTRAVENOUS

## 2015-12-19 MED ORDER — ENOXAPARIN SODIUM 40 MG/0.4ML ~~LOC~~ SOLN
40.0000 mg | Freq: Every day | SUBCUTANEOUS | Status: DC
  Administered 2015-12-19: 40 mg via SUBCUTANEOUS
  Filled 2015-12-19: qty 0.4

## 2015-12-19 MED ORDER — ASPIRIN EC 325 MG PO TBEC: 325.0000 mg | DELAYED_RELEASE_TABLET | Freq: Every day | ORAL | Status: DC

## 2015-12-19 MED ORDER — ASPIRIN EC 81 MG PO TBEC
81.0000 mg | DELAYED_RELEASE_TABLET | Freq: Every day | ORAL | Status: DC
  Administered 2015-12-19: 81 mg via ORAL
  Filled 2015-12-19: qty 1

## 2015-12-19 MED ORDER — ASPIRIN 81 MG PO TBEC: 81.0000 mg | DELAYED_RELEASE_TABLET | Freq: Every day | ORAL | Status: DC

## 2015-12-19 NOTE — Progress Notes (Signed)
Discharge instructions along with home medication list and follow up gone over with patient. Patient verbalized that she understood instructions. Printed rx given to patient. Iv removed and telemetry removed. No c/o pain no distress noted. Patient being discharged home

## 2015-12-19 NOTE — Telephone Encounter (Signed)
-----   Message from Coralee RudSabrina F Gilley sent at 12/19/2015  2:59 PM EDT ----- Regarding: tcm/ph 8/3 2:30 Eula Listenyan Dunn, PA-C

## 2015-12-19 NOTE — Progress Notes (Signed)
Admitted from home. A&O. Independent. Ambulated to bathroom with no problem. NPO for possible stress test./

## 2015-12-19 NOTE — Consult Note (Signed)
Cardiology Consultation Note  Patient ID: Claudia Friedman, MRN: [id number], DOB/AGE: [birth year] 82 y.o. Admit date: 12/18/2015   Date of Consult: 12/19/2015 Primary Physician: No PCP Per Patient Primary Cardiologist: New to [facility] Requesting Physician: Dr. Anne Hahn, MD  Chief Complaint: Chest pain Reason for Consult: Chest pain  HPI: 82 y.o. female with h/o HTN and previous severe anxiety now better controleld. She has not seen an MD in the outpatient setting for at least 12 years. She is very active at baseline.   No previously known cardiac diagnoses. Patient recently presented to the [facility] ED on 12/05/15 with complaints of bilateral arm pain after doing increased yard work. She denied ever having any chest pain at that time, nor any SOB. In the ED she ruled out for ACS. EKG was non-acute. CXR was without acute process. She was discharged from the ED apparently with diagnosis of arthritis. She returned to [facility] on 7/17 with complaints of increased anxiety and a 2 week history of chest and back pain across her shoulders. On 7/17 she was taking out her garbage can and noted left-sided chest tightness that may have radiated to her left arm. In the ED her pain was improved. She denies chest pain and describes it as a "tightness."  Upon the patient's arrival to [facility] they were found to have troponin negative x 2, SCr 1.07-->0.94, K+ 3.9-->3.7, unremarkable CBC. ECG showed NSR, 73 bpm, LVH, no acute st/t changes, CXR showed no active cardiopulmonary process. CTA chest/aorta showed no active acute aortic process or other process. There was atherosclerosis noted of the coronary arteries. He has been scheduled for nuclear stress test by IM. Currently, without chest pain.   Past Medical History  Diagnosis Date  . Hypertension       Most Recent Cardiac Studies: Self reported prior stress test years ago in the setting of left-sided chest pain: normal per her report   Surgical History:  Past Surgical History    Procedure Laterality Date  . Cholecystectomy       Home Meds: Prior to Admission medications   Medication Sig Start Date End Date Taking? Authorizing Provider  aspirin EC 325 MG tablet Take 325 mg by mouth daily as needed for mild pain or moderate pain.   Yes Historical Provider, MD  naproxen sodium (RA NAPROXEN SODIUM) 220 MG tablet Take 220-440 mg by mouth every 6 (six) hours as needed. For pain and sleep.   Yes Historical Provider, MD    Inpatient Medications:  . aspirin EC  325 mg Oral Daily  . enoxaparin (LOVENOX) injection  40 mg Subcutaneous QHS  . nitroGLYCERIN  0.5 inch Topical Q6H  . sodium chloride flush  3 mL Intravenous Q12H      Allergies: No Known Allergies  Social History   Social History  . Marital Status: Divorced    Spouse Name: N/A  . Number of Children: N/A  . Years of Education: N/A   Occupational History  . Not on file.   Social History Main Topics  . Smoking status: Never Smoker   . Smokeless tobacco: Never Used  . Alcohol Use: Yes     Comment: occasional   . Drug Use: No  . Sexual Activity: Not on file   Other Topics Concern  . Not on file   Social History Narrative     Family History  Problem Relation Age of Onset  . CAD    . Osteoporosis       Review of Systems: Review  of Systems  Constitutional: Positive for malaise/fatigue. Negative for fever, chills, weight loss and diaphoresis.  HENT: Negative for congestion.   Eyes: Negative for discharge and redness.  Respiratory: Negative for cough, sputum production, shortness of breath and wheezing.   Cardiovascular: Positive for chest pain. Negative for palpitations, orthopnea, claudication, leg swelling and PND.  Gastrointestinal: Negative for nausea, vomiting and abdominal pain.  Musculoskeletal: Negative for falls.  Skin: Negative for rash.  Neurological: Negative for dizziness, sensory change, speech change, focal weakness, loss of consciousness and weakness.   Endo/Heme/Allergies: Does not bruise/bleed easily.  Psychiatric/Behavioral: Negative for substance abuse. The patient is not nervous/anxious.   All other systems reviewed and are negative.   Labs:  Recent Labs  12/18/15 1952 12/19/15 0130  TROPONINI <0.03 <0.03   Lab Results  Component Value Date   WBC 8.3 12/19/2015   HGB 13.1 12/19/2015   HCT 37.9 12/19/2015   MCV 92.5 12/19/2015   PLT 260 12/19/2015     Recent Labs Lab 12/19/15 0130  NA 140  K 3.7  CL 110  CO2 25  BUN 14  CREATININE 0.94  CALCIUM 8.4*  GLUCOSE 95   No results found for: CHOL, HDL, LDLCALC, TRIG No results found for: DDIMER  Radiology/Studies:  Dg Chest 2 View  12/18/2015  CLINICAL DATA:  Chest tightness and lower back pain today. EXAM: CHEST  2 VIEW COMPARISON:  Chest x-ray dated 12/05/2015. FINDINGS: Heart size is upper normal, stable. Overall cardiomediastinal silhouette is stable in size and configuration. Mild atherosclerotic changes noted at the aortic arch. Age-related aortic ectasia appears stable. Lungs are clear. No pleural effusion or pneumothorax seen. Mild degenerative spurring again noted within the slightly kyphotic thoracic spine. IMPRESSION: No active cardiopulmonary disease.  No evidence of pneumonia. Aortic atherosclerosis. Electronically Signed   By: Bary RichardStan  Maynard M.D.   On: 12/18/2015 20:07   Dg Chest 2 View  12/05/2015  CLINICAL DATA:  BILATERAL upper arm pain beginning earlier today. Some radiation to the LEFT posterior shoulder. EXAM: CHEST  2 VIEW COMPARISON:  None. FINDINGS: The heart size and mediastinal contours are within normal limits. Tortuous and calcified thoracic aorta. Both lungs are clear. The visualized skeletal structures are unremarkable. IMPRESSION: No active cardiopulmonary disease.  Aortic atherosclerosis. Electronically Signed   By: Elsie StainJohn T Curnes M.D.   On: 12/05/2015 07:39   Ct Angio Chest Aorta W/cm &/or Wo/cm  12/18/2015  CLINICAL DATA:  Back and shoulder  pain for 2 weeks. Evaluate for dissection. EXAM: CT ANGIOGRAPHY CHEST WITH CONTRAST TECHNIQUE: Multidetector CT imaging of the chest was performed using the standard protocol during bolus administration of intravenous contrast. Multiplanar CT image reconstructions and MIPs were obtained to evaluate the vascular anatomy. CONTRAST:  75 cc Isovue 370 intravenous COMPARISON:  None. FINDINGS: Cardiovascular: Mild cardiomegaly. No pericardial effusion. Atherosclerosis, including the coronary arteries. No evidence of intramural hematoma, dissection, or rupture of the aorta. Ductus bump is noted. The aorta is tortuous without fusiform aneurysm. Opacified pulmonary arteries negative for filling defect. Mediastinum:  Negative for adenopathy. Lungs/Pleura: There is no edema, consolidation, effusion, or pneumothorax. Mild dependent atelectasis or scar. Upper abdomen: Cholecystectomy. Renal sinus cysts on the left and cortical cysts on the right. Musculoskeletal: Degenerative changes without acute or aggressive finding. Review of the MIP images confirms the above findings. IMPRESSION: No evidence of acute aortic syndrome or other acute process. Electronically Signed   By: Marnee SpringJonathon  Watts M.D.   On: 12/18/2015 22:40    EKG: Interpreted by me  showed: NSR, 73 bpm, LVH, no acute st/t changes Telemetry: Interpreted by me showed: NSR, 70's bpm  Weights: Filed Weights   12/18/15 1944  Weight: 192 lb (87.091 kg)     Physical Exam: Blood pressure 135/51, pulse 56, temperature 97.8 F (36.6 C), temperature source Oral, resp. rate 16, height 5\' 6"  (1.676 m), weight 192 lb (87.091 kg), SpO2 96 %. Body mass index is 31 kg/(m^2). General: Well developed, well nourished, in no acute distress. Head: Normocephalic, atraumatic, sclera non-icteric, no xanthomas, nares are without discharge.  Neck: Negative for carotid bruits. JVD not elevated. Lungs: Clear bilaterally to auscultation without wheezes, rales, or rhonchi. Breathing  is unlabored. Heart: RRR with S1 S2. No murmurs, rubs, or gallops appreciated. Abdomen: Obese, soft, non-tender, non-distended with normoactive bowel sounds. No hepatomegaly. No rebound/guarding. No obvious abdominal masses. Msk:  Strength and tone appear normal for age. Extremities: No clubbing or cyanosis. No edema. Distal pedal pulses are 2+ and equal bilaterally. Neuro: Alert and oriented X 3. No facial asymmetry. No focal deficit. Moves all extremities spontaneously. Psych:  Responds to questions appropriately with a normal affect.    Assessment and Plan:  Principal Problem:   Chest pain with moderate risk for cardiac etiology Active Problems:   Uncontrolled hypertension   Anxiety    1. Chest pain with moderate risk of cardiac etiology: -CTA chest shoed atherosclerosis of the coronary arteries -She is currently without pain -She has ruled out, pending final troponin -Echo is pending per IM -She is very active at baseline, mowing her lawn with a push mower  -She is for nuclear stress test today per IM -Further recommendations pending stress test  2. Accelerated HTN: -Presented with SBP in the 180's mm Hg -Improved to the 130's systolic -Continue current antihypertensives  3. Risk stratification: -Check lipid and A1C -Treatment pending these results  4. Remaining per IM   Signed, Eula Listen, PA-C [facility] Pager: [phone] 12/19/2015, 8:22 AM

## 2015-12-19 NOTE — Progress Notes (Signed)
*  PRELIMINARY RESULTS* Echocardiogram 2D Echocardiogram has been performed.  Cristela BlueHege, Hannalee Castor 12/19/2015, 7:54 AM

## 2015-12-19 NOTE — Progress Notes (Signed)
[facility] [place] [facility]   PATIENT NAME: Claudia Friedman  SUBJECTIVE:  CHIEF COMPLAINT:   Chief Complaint  Patient presents with  . Chest Pain  . Back Pain   -Admitted with chest pain. Denies any chest pain now. -For Myoview today.  REVIEW OF SYSTEMS:  Review of Systems  Constitutional: Negative for fever, chills and malaise/fatigue.  HENT: Negative for ear discharge, ear pain and nosebleeds.   Eyes: Negative for blurred vision and double vision.  Respiratory: Negative for cough, shortness of breath and wheezing.   Cardiovascular: Negative for chest pain, palpitations and leg swelling.  Gastrointestinal: Negative for nausea, vomiting, abdominal pain, diarrhea and constipation.  Genitourinary: Negative for dysuria.  Neurological: Negative for dizziness, sensory change, speech change, focal weakness, seizures and headaches.  Psychiatric/Behavioral: The patient is nervous/anxious.     DRUG ALLERGIES:  No Known Allergies  VITALS:  Blood pressure 135/51, pulse 56, temperature 97.8 F (36.6 C), temperature source Oral, resp. rate 16, height 5\' 6"  (1.676 m), weight 87.091 kg (192 lb), SpO2 96 %.  PHYSICAL EXAMINATION:  Physical Exam  GENERAL:  82 y.o.-year-old patient lying in the bed with no acute distress.  EYES: Pupils equal, round, reactive to light and accommodation. No scleral icterus. Extraocular muscles intact.  HEENT: Head atraumatic, normocephalic. Oropharynx and nasopharynx clear.  NECK:  Supple, no jugular venous distention. No thyroid enlargement, no tenderness.  LUNGS: Normal breath sounds bilaterally, no wheezing, rales,rhonchi or crepitation. No use of accessory muscles of respiration.  CARDIOVASCULAR: S1, S2 normal. No murmurs, rubs, or gallops.  ABDOMEN: Soft, nontender, nondistended. Bowel sounds present. No organomegaly or mass.  EXTREMITIES: No pedal edema, cyanosis, or  clubbing.  NEUROLOGIC: Cranial nerves II through XII are intact. Muscle strength 5/5 in all extremities. Sensation intact. Gait not checked.  PSYCHIATRIC: The patient is alert and oriented x 3.  SKIN: No obvious rash, lesion, or ulcer.    LABORATORY PANEL:   CBC  Recent Labs Lab 12/19/15 0130  WBC 8.3  HGB 13.1  HCT 37.9  PLT 260   ------------------------------------------------------------------------------------------------------------------  Chemistries   Recent Labs Lab 12/19/15 0130  NA 140  K 3.7  CL 110  CO2 25  GLUCOSE 95  BUN 14  CREATININE 0.94  CALCIUM 8.4*   ------------------------------------------------------------------------------------------------------------------  Cardiac Enzymes  Recent Labs Lab 12/19/15 0835  TROPONINI <0.03   ------------------------------------------------------------------------------------------------------------------  RADIOLOGY:  Dg Chest 2 View  12/18/2015  CLINICAL DATA:  Chest tightness and lower back pain today. EXAM: CHEST  2 VIEW COMPARISON:  Chest x-ray dated 12/05/2015. FINDINGS: Heart size is upper normal, stable. Overall cardiomediastinal silhouette is stable in size and configuration. Mild atherosclerotic changes noted at the aortic arch. Age-related aortic ectasia appears stable. Lungs are clear. No pleural effusion or pneumothorax seen. Mild degenerative spurring again noted within the slightly kyphotic thoracic spine. IMPRESSION: No active cardiopulmonary disease.  No evidence of pneumonia. Aortic atherosclerosis. Electronically Signed   By: Bary RichardStan  Maynard M.D.   On: 12/18/2015 20:07   Ct Angio Chest Aorta W/cm &/or Wo/cm  12/18/2015  CLINICAL DATA:  Back and shoulder pain for 2 weeks. Evaluate for dissection. EXAM: CT ANGIOGRAPHY CHEST WITH CONTRAST TECHNIQUE: Multidetector CT imaging of the chest was performed using the standard protocol during bolus administration of intravenous contrast. Multiplanar CT  image reconstructions and MIPs were obtained to evaluate the vascular anatomy. CONTRAST:  75 cc Isovue 370 intravenous COMPARISON:  None. FINDINGS: Cardiovascular: Mild cardiomegaly. No pericardial effusion. Atherosclerosis, including the coronary arteries. No evidence of intramural hematoma, dissection, or rupture of the aorta. Ductus bump is noted. The aorta is tortuous without fusiform aneurysm. Opacified pulmonary arteries negative for filling defect. Mediastinum:  Negative for adenopathy. Lungs/Pleura: There is no edema, consolidation, effusion, or pneumothorax. Mild dependent atelectasis or scar. Upper abdomen: Cholecystectomy. Renal sinus cysts on the left and cortical cysts on the right. Musculoskeletal: Degenerative changes without acute or aggressive finding. Review of the MIP images confirms the above findings. IMPRESSION: No evidence of acute aortic syndrome or other acute process. Electronically Signed   By: Marnee Spring M.D.   On: 12/18/2015 22:40    EKG:   Orders placed or performed during the hospital encounter of 12/18/15  . ED EKG within 10 minutes  . ED EKG within 10 minutes    ASSESSMENT AND PLAN:   82 year old female with past medical history significant for hypertension, history of anxiety presents to the hospital secondary to chest pressure.  #1 chest pain-either angina or anxiety related. -Troponins negative 3. Ruled out for MI. -Cardiology consult appreciated. -For nuclear stress test this morning. -CT of the chest negative for any acute processes, no pulmonary embolism. However atherosclerosis noted in coronary arteries. - Currently on aspirin. Heart rate borderline low, so no beta blocker. -Check lipid panel.  #2 hypertension-normal blood pressures for her age. -Does not take any medications at home.  #3 anxiety disorder-used to be on Xanax in the past, feels much better now. -As needed Xanax. Will need a primary care physician referral.  #4 DVT  prophylaxis-on Lovenox     All the records are reviewed and case discussed with Care Management/Social Workerr. Management plans discussed with the patient, family and they are in agreement.  CODE STATUS: Full code  TOTAL TIME TAKING CARE OF THIS PATIENT: 35 minutes.   POSSIBLE D/C IN 1 DAYS, DEPENDING ON CLINICAL CONDITION.   Enid Baas M.D on 12/19/2015 at 9:46 AM  Between 7am to 6pm - Pager - [phone]  After 6pm go to [email or web address] - password [email or web address] [facility]  [facility]  Office  [phone]  CC: Primary care physician; No PCP Per Patient

## 2015-12-19 NOTE — Telephone Encounter (Signed)
Patient [facility]. Patient understands to follow up with Eula Listenyan Dunn, PA on 8/3/7 at 2:30 [facility]. Patient understands discharge instructions? yes Patient understands medications and regiment? yes Patient understands to bring all medications to this visit? yes

## 2015-12-20 NOTE — Discharge Summary (Signed)
[facility] [place] at [facility]   PATIENT NAME: Claudia Friedman    MR#:  [id number]  DATE OF BIRTH:  [birth year]  DATE OF ADMISSION:  12/18/2015 ADMITTING PHYSICIAN: Oralia Manis, MD  DATE OF DISCHARGE: 12/19/2015  3:55 PM  PRIMARY CARE PHYSICIAN: No PCP Per Patient    ADMISSION DIAGNOSIS:  Chest pain with moderate risk for cardiac etiology [R07.9]  DISCHARGE DIAGNOSIS:  Principal Problem:   Chest pain with moderate risk for cardiac etiology Active Problems:   Uncontrolled hypertension   Anxiety   SECONDARY DIAGNOSIS:   Past Medical History  Diagnosis Date  . Hypertension     HOSPITAL COURSE:   82 year old female with past medical history significant for hypertension, history of anxiety presents to the hospital secondary to chest pressure.  #1 chest pain- anxiety related. -Troponins negative 3. Ruled out for MI. -Cardiology consult appreciated. -Negative myoview.  -CT of the chest negative for any acute processes, no pulmonary embolism.  - on aspirin. Heart rate borderline low, so no beta blocker.  #2 hypertension-normal blood pressures for her age. -Does not take any medications at home.  #3 anxiety disorder-used to be on Xanax in the past, feels much better now. -As needed Xanax. Will need a primary care physician referral.  Stable for discharge today  DISCHARGE CONDITIONS:   Stable  CONSULTS OBTAINED:  Treatment Team:  Antonieta Iba, MD  DRUG ALLERGIES:  No Known Allergies  DISCHARGE MEDICATIONS:   Discharge Medication List as of 12/19/2015  3:00 PM    START taking these medications   Details  ALPRAZolam (XANAX) 0.25 MG tablet Take 1 tablet (0.25 mg total) by mouth 2 (two) times daily as needed for anxiety., Starting 12/19/2015, Until Discontinued, Print      CONTINUE these medications which have CHANGED   Details  aspirin EC 81 MG EC tablet Take 1 tablet (81 mg total) by mouth daily., Starting 12/19/2015, Until  Discontinued, Normal      CONTINUE these medications which have NOT CHANGED   Details  naproxen sodium (RA NAPROXEN SODIUM) 220 MG tablet Take 220-440 mg by mouth every 6 (six) hours as needed. For pain and sleep., Until Discontinued, Historical Med         DISCHARGE INSTRUCTIONS:   1. PCP f/u in 1-2 weeks   If you experience worsening of your admission symptoms, develop shortness of breath, life threatening emergency, suicidal or homicidal thoughts you must seek medical attention immediately by calling 911 or calling your MD immediately  if symptoms less severe.  You Must read complete instructions/literature along with all the possible adverse reactions/side effects for all the Medicines you take and that have been prescribed to you. Take any new Medicines after you have completely understood and accept all the possible adverse reactions/side effects.   Please note  You were cared for by a hospitalist during your hospital stay. If you have any questions about your discharge medications or the care you received while you were in the hospital after you are discharged, you can call the unit and asked to speak with the hospitalist on call if the hospitalist that took care of you is not available. Once you are discharged, your primary care physician will handle any further medical issues. Please note that NO REFILLS for any discharge medications will be authorized once you are discharged, as it is imperative that you return to your primary care physician (or establish a relationship with a primary care physician if you do  not have one) for your aftercare needs so that they can reassess your need for medications and monitor your lab values.    Today   CHIEF COMPLAINT:   Chief Complaint  Patient presents with  . Chest Pain  . Back Pain    VITAL SIGNS:  Blood pressure 139/58, pulse 61, temperature 97.7 F (36.5 C), temperature source Oral, resp. rate 20, height  (1.676 m), weight  87.091 kg (192 lb), SpO2 96 %.  I/O:  No intake or output data in the 24 hours ending 12/20/15 1620  PHYSICAL EXAMINATION:   Physical Exam  GENERAL: 82 y.o.-year-old patient lying in the bed with no acute distress.  EYES: Pupils equal, round, reactive to light and accommodation. No scleral icterus. Extraocular muscles intact.  HEENT: Head atraumatic, normocephalic. Oropharynx and nasopharynx clear.  NECK: Supple, no jugular venous distention. No thyroid enlargement, no tenderness.  LUNGS: Normal breath sounds bilaterally, no wheezing, rales,rhonchi or crepitation. No use of accessory muscles of respiration.  CARDIOVASCULAR: S1, S2 normal. No murmurs, rubs, or gallops.  ABDOMEN: Soft, nontender, nondistended. Bowel sounds present. No organomegaly or mass.  EXTREMITIES: No pedal edema, cyanosis, or clubbing.  NEUROLOGIC: Cranial nerves II through XII are intact. Muscle strength 5/5 in all extremities. Sensation intact. Gait not checked.  PSYCHIATRIC: The patient is alert and oriented x 3.  SKIN: No obvious rash, lesion, or ulcer.    DATA REVIEW:   CBC  Recent Labs Lab 12/19/15 0130  WBC 8.3  HGB 13.1  HCT 37.9  PLT 260    Chemistries   Recent Labs Lab 12/19/15 0130  NA 140  K 3.7  CL 110  CO2 25  GLUCOSE 95  BUN 14  CREATININE 0.94  CALCIUM 8.4*    Cardiac Enzymes  Recent Labs Lab 12/19/15 0835  TROPONINI <0.03    Microbiology Results  No results found for this or any previous visit.  RADIOLOGY:  Dg Chest 2 View  12/18/2015  CLINICAL DATA:  Chest tightness and lower back pain today. EXAM: CHEST  2 VIEW COMPARISON:  Chest x-ray dated 12/05/2015. FINDINGS: Heart size is upper normal, stable. Overall cardiomediastinal silhouette is stable in size and configuration. Mild atherosclerotic changes noted at the aortic arch. Age-related aortic ectasia appears stable. Lungs are clear. No pleural effusion or pneumothorax seen. Mild degenerative spurring  again noted within the slightly kyphotic thoracic spine. IMPRESSION: No active cardiopulmonary disease.  No evidence of pneumonia. Aortic atherosclerosis. Electronically Signed   By: Bary Richard M.D.   On: 12/18/2015 20:07   Nm Myocar Multi W/spect W/wall Motion / Ef  12/19/2015  Pharmacological myocardial perfusion imaging study with no significant  ischemia Normal wall motion, EF estimated at 76% No EKG changes concerning for ischemia at peak stress or in recovery. Low risk scan Signed, Dossie Arbour, MD, Ph.D [facility]   Ct Angio Chest Aorta W/cm &/or Wo/cm  12/18/2015  CLINICAL DATA:  Back and shoulder pain for 2 weeks. Evaluate for dissection. EXAM: CT ANGIOGRAPHY CHEST WITH CONTRAST TECHNIQUE: Multidetector CT imaging of the chest was performed using the standard protocol during bolus administration of intravenous contrast. Multiplanar CT image reconstructions and MIPs were obtained to evaluate the vascular anatomy. CONTRAST:  75 cc Isovue 370 intravenous COMPARISON:  None. FINDINGS: Cardiovascular: Mild cardiomegaly. No pericardial effusion. Atherosclerosis, including the coronary arteries. No evidence of intramural hematoma, dissection, or rupture of the aorta. Ductus bump is noted. The aorta is tortuous without fusiform aneurysm. Opacified pulmonary arteries negative for  filling defect. Mediastinum:  Negative for adenopathy. Lungs/Pleura: There is no edema, consolidation, effusion, or pneumothorax. Mild dependent atelectasis or scar. Upper abdomen: Cholecystectomy. Renal sinus cysts on the left and cortical cysts on the right. Musculoskeletal: Degenerative changes without acute or aggressive finding. Review of the MIP images confirms the above findings. IMPRESSION: No evidence of acute aortic syndrome or other acute process. Electronically Signed   By: Marnee SpringJonathon  Watts M.D.   On: 12/18/2015 22:40    EKG:   Orders placed or performed during the hospital encounter of 12/18/15  . ED EKG within  10 minutes  . ED EKG within 10 minutes  . EKG      Management plans discussed with the patient, family and they are in agreement.  CODE STATUS:  Code Status History    Date Active Date Inactive Code Status Order ID Comments User Context   12/19/2015 12:20 AM 12/19/2015  6:58 PM Full Code [id number]  Oralia Manisavid Willis, MD ED      TOTAL TIME TAKING CARE OF THIS PATIENT: 37 minutes.    Enid BaasKALISETTI,Claudia Friedman M.D on 12/20/2015 at 4:20 PM  Between 7am to 6pm - Pager - [phone]  After 6pm go to [email or web address] - password [email or web address]  [facility]  Office  [phone]  CC: Primary care physician; No PCP Per Patient

## 2016-01-02 ENCOUNTER — Encounter: Payer: Self-pay | Admitting: Physician Assistant

## 2016-01-04 ENCOUNTER — Ambulatory Visit (INDEPENDENT_AMBULATORY_CARE_PROVIDER_SITE_OTHER): Payer: Medicare Other | Admitting: Physician Assistant

## 2016-01-04 ENCOUNTER — Encounter (INDEPENDENT_AMBULATORY_CARE_PROVIDER_SITE_OTHER): Payer: Self-pay

## 2016-01-04 ENCOUNTER — Encounter: Payer: Self-pay | Admitting: Physician Assistant

## 2016-01-04 VITALS — BP 144/90 | HR 88 | Ht 66.0 in | Wt 193.0 lb

## 2016-01-04 DIAGNOSIS — R079 Chest pain, unspecified: Secondary | ICD-10-CM

## 2016-01-04 DIAGNOSIS — I10 Essential (primary) hypertension: Secondary | ICD-10-CM

## 2016-01-04 DIAGNOSIS — F419 Anxiety disorder, unspecified: Secondary | ICD-10-CM

## 2016-01-04 NOTE — Patient Instructions (Signed)
Follow-Up: Your physician wants you to follow-up in: 6 months. You will receive a reminder letter in the mail two months in advance. If you don't receive a letter, please call our office to schedule the follow-up appointment.  It was a pleasure seeing you today here in the office. Please do not hesitate to give us a call back if you have any further questions. [phone]  Mercie Balsley A. RN, BSN     

## 2016-01-04 NOTE — Progress Notes (Signed)
Cardiology Office Note Date:  01/04/2016  Patient ID:  Claudia Friedman, DOB 05-11-1931, MRN [id number] PCP:  No PCP Per Patient  Cardiologist:  Dr. Mariah Milling, MD    Chief Complaint: Hospital follow up  History of Present Illness: Claudia Friedman is a 82 y.o. female with history of HTN, previous severe anxiety now better controlled, and has not been seen in the outpatient setting for at least the past 12 years who presents for hospital follow up from recent admission to [facility] from 7/17 to 7/18 for chest pain felt to be in the setting of her anxiety. She is very active at baseline.    No previously known cardiac diagnoses. Patient recently presented to the [facility] ED on 12/05/15 with complaints of bilateral arm pain after doing increased yard work. She denied ever having any chest pain at that time, nor any SOB. In the ED she ruled out for ACS. EKG was non-acute. CXR was without acute process. She was discharged from the ED apparently with diagnosis of arthritis. She returned to [facility] on 7/17 with complaints of increased anxiety and a 2 week history of chest and back pain across her shoulders. On 7/17 she was taking out her garbage can and noted left-sided chest tightness that may have radiated to her left arm. In the ED her pain was improved. She denied chest pain and describes it as a "tightness." Troponin negative x 3, SCr 1.07-->0.94, K+ 3.9-->3.7, unremarkable CBC. ECG showed NSR, 73 bpm, LVH, no acute st/t changes, CXR showed no active cardiopulmonary process. CTA chest/aorta showed no active acute aortic process or other process. There was atherosclerosis noted of the coronary arteries. She underwent lexiscan myoview that showed no significant ischemia, normal wall motion, EF 76%, no EKG changes concerning for ischemia at peak exercise or with recovery. Overall, this was a low risk study. Echo from 12/19/15 showed normal EF at 60-65%, no RWMA, LV diastolic function was normal, mild AI, left atrium was normal in  size, PASP normal. She was discharged with non-cardiac chest pain with outpatient follow up.   She is doing well today. No further chest pain or shoulder pain. She has resumed her activities at baseline without issues. She just worked outdoors this week without any chest pain or SOB. Her anxiety is improved as well. She has not needed to take any Xanax. She does not have a BP cuff at home, though does check her readings at [facility] with systolic readings in the 130's. She reports anxiety when coming to the MD office. She does not have any concerns today.    Past Medical History:  Diagnosis Date  . Anxiety   . History of echocardiogram    a. 12/19/15: EF 60-65%, no RWMA, nl LV diastolic fxn, mild AI, LA nl, PASP nl  . History of nuclear stress test    a. 12/19/15: no evidence of ischemia, EF 76%, low risk study  . Hypertension     Past Surgical History:  Procedure Laterality Date  . CHOLECYSTECTOMY      Current Outpatient Prescriptions  Medication Sig Dispense Refill  . ALPRAZolam (XANAX) 0.25 MG tablet Take 1 tablet (0.25 mg total) by mouth 2 (two) times daily as needed for anxiety. 20 tablet 0  . aspirin EC 81 MG EC tablet Take 1 tablet (81 mg total) by mouth daily. 30 tablet 2  . naproxen sodium (RA NAPROXEN SODIUM) 220 MG tablet Take 220-440 mg by mouth every 6 (six) hours as needed. For pain and sleep.  No current facility-administered medications for this visit.     Allergies:   Review of patient's allergies indicates no known allergies.   Social History:  The patient  reports that she has never smoked. She has never used smokeless tobacco. She reports that she drinks alcohol. She reports that she does not use drugs.   Family History:  The patient's family history is not on file.  ROS:   Review of Systems  Constitutional: Negative for chills, diaphoresis, fever, malaise/fatigue and weight loss.  HENT: Negative for congestion.   Eyes: Negative for discharge and redness.    Respiratory: Negative for cough, sputum production, shortness of breath and wheezing.   Cardiovascular: Negative for chest pain, palpitations, orthopnea, claudication, leg swelling and PND.  Gastrointestinal: Negative for abdominal pain, heartburn, nausea and vomiting.  Musculoskeletal: Negative for falls and myalgias.  Skin: Negative for rash.  Neurological: Negative for dizziness, tingling, tremors, sensory change, speech change, focal weakness, loss of consciousness and weakness.  Endo/Heme/Allergies: Does not bruise/bleed easily.  Psychiatric/Behavioral: Negative for substance abuse. The patient is nervous/anxious.   All other systems reviewed and are negative.    PHYSICAL EXAM:  VS:  BP (!) 144/90 (BP Location: Left Arm, Patient Position: Sitting, Cuff Size: Normal)   Pulse 88   Ht 5\' 6"  (1.676 m)   Wt 193 lb (87.5 kg)   BMI 31.15 kg/m  BMI: Body mass index is 31.15 kg/m.  Physical Exam  Constitutional: She is oriented to person, place, and time. She appears well-developed and well-nourished.  HENT:  Head: Normocephalic and atraumatic.  Eyes: Right eye exhibits no discharge. Left eye exhibits no discharge.  Neck: Normal range of motion. No JVD present.  Cardiovascular: Normal rate, regular rhythm, S1 normal, S2 normal and normal heart sounds.  Exam reveals no distant heart sounds, no friction rub, no midsystolic click and no opening snap.   No murmur heard. Pulmonary/Chest: Effort normal and breath sounds normal. No respiratory distress. She has no decreased breath sounds. She has no wheezes. She has no rales. She exhibits no tenderness.  Abdominal: Soft. She exhibits no distension. There is no tenderness.  Musculoskeletal: She exhibits no edema.  Neurological: She is alert and oriented to person, place, and time.  Skin: Skin is warm and dry. No cyanosis. Nails show no clubbing.  Psychiatric: She has a normal mood and affect. Her speech is normal and behavior is normal.  Judgment and thought content normal.     EKG:  Was not ordered today.  Recent Labs: 12/19/2015: BUN 14; Creatinine, Ser 0.94; Hemoglobin 13.1; Platelets 260; Potassium 3.7; Sodium 140  12/19/2015: Cholesterol 185; HDL 57; LDL Cholesterol 100; Total CHOL/HDL Ratio 3.2; Triglycerides 138; VLDL 28   Estimated Creatinine Clearance: 50.5 mL/min (by C-G formula based on SCr of 0.94 mg/dL).   Wt Readings from Last 3 Encounters:  01/04/16 193 lb (87.5 kg)  12/18/15 192 lb (87.1 kg)  12/05/15 193 lb (87.5 kg)     Other studies reviewed: Additional studies/records reviewed today include: summarized above  ASSESSMENT AND PLAN:  1. Chest pain with low risk for cardiac etiology: No further chest pain. Has resumed her exertional activities outdoors without any symptoms concerning for angina. No further ischemic evaluations at this time.   2. HTN: BP 130's systolic in the hospital. She reports getting nervous at the MD office. She will check BP at home and call us with results. If BP is greater than 140 systolic consistently plan to start low-dose antihypertensive.  3. Anxiety: Much improved. Xanax prn per PCP.   Disposition: F/u with Dr. Mariah Milling in 6 months.   Current medicines are reviewed at length with the patient today.  The patient did not have any concerns regarding medicines.  Elinor Dodge PA-C 01/04/2016 2:48 PM     [facility] [place] [address] [place], [place] [address] [phone]

## 2016-03-13 DIAGNOSIS — Z23 Encounter for immunization: Secondary | ICD-10-CM | POA: Diagnosis not present

## 2016-08-16 ENCOUNTER — Encounter: Payer: Self-pay | Admitting: Cardiovascular Disease

## 2016-09-17 ENCOUNTER — Ambulatory Visit: Payer: Medicare Other | Admitting: Cardiovascular Disease

## 2016-09-17 ENCOUNTER — Institutional Professional Consult (permissible substitution): Payer: Medicare Other | Admitting: Internal Medicine

## 2016-09-27 ENCOUNTER — Telehealth: Payer: Self-pay | Admitting: Cardiovascular Disease

## 2016-09-27 NOTE — Telephone Encounter (Signed)
Patient not interested in scheduling an appt or following up with clinic  6 month f/u per checkout 01/04/2016   Deleting recall

## 2017-03-06 DIAGNOSIS — Z23 Encounter for immunization: Secondary | ICD-10-CM | POA: Diagnosis not present

## 2018-03-24 DIAGNOSIS — Z23 Encounter for immunization: Secondary | ICD-10-CM | POA: Diagnosis not present

## 2018-12-31 ENCOUNTER — Other Ambulatory Visit: Payer: Self-pay

## 2019-03-03 DIAGNOSIS — Z23 Encounter for immunization: Secondary | ICD-10-CM | POA: Diagnosis not present

## 2019-08-10 ENCOUNTER — Emergency Department: Payer: Medicare Other

## 2019-08-10 ENCOUNTER — Encounter: Payer: Self-pay | Admitting: Emergency Medicine

## 2019-08-10 ENCOUNTER — Emergency Department
Admission: EM | Admit: 2019-08-10 | Discharge: 2019-08-10 | Disposition: A | Discharge status: Home / Self Care | Payer: Medicare Other | Attending: Emergency Medicine | Admitting: Emergency Medicine

## 2019-08-10 ENCOUNTER — Other Ambulatory Visit: Payer: Self-pay

## 2019-08-10 DIAGNOSIS — R61 Generalized hyperhidrosis: Secondary | ICD-10-CM | POA: Insufficient documentation

## 2019-08-10 DIAGNOSIS — M549 Dorsalgia, unspecified: Secondary | ICD-10-CM | POA: Diagnosis present

## 2019-08-10 DIAGNOSIS — M546 Pain in thoracic spine: Secondary | ICD-10-CM | POA: Diagnosis not present

## 2019-08-10 DIAGNOSIS — Z7982 Long term (current) use of aspirin: Secondary | ICD-10-CM | POA: Insufficient documentation

## 2019-08-10 DIAGNOSIS — M1990 Unspecified osteoarthritis, unspecified site: Secondary | ICD-10-CM | POA: Insufficient documentation

## 2019-08-10 DIAGNOSIS — I10 Essential (primary) hypertension: Secondary | ICD-10-CM | POA: Insufficient documentation

## 2019-08-10 DIAGNOSIS — G8929 Other chronic pain: Secondary | ICD-10-CM

## 2019-08-10 DIAGNOSIS — R222 Localized swelling, mass and lump, trunk: Secondary | ICD-10-CM | POA: Diagnosis not present

## 2019-08-10 LAB — CBC WITH DIFFERENTIAL/PLATELET
Abs Immature Granulocytes: 0.03 10*3/uL (ref 0.00–0.07)
Basophils Absolute: 0.1 10*3/uL (ref 0.0–0.1)
Basophils Relative: 1 %
Eosinophils Absolute: 0.1 10*3/uL (ref 0.0–0.5)
Eosinophils Relative: 2 %
HCT: 41.9 % (ref 36.0–46.0)
Hemoglobin: 13.7 g/dL (ref 12.0–15.0)
Immature Granulocytes: 0 %
Lymphocytes Relative: 34 %
Lymphs Abs: 2.9 10*3/uL (ref 0.7–4.0)
MCH: 30.6 pg (ref 26.0–34.0)
MCHC: 32.7 g/dL (ref 30.0–36.0)
MCV: 93.7 fL (ref 80.0–100.0)
Monocytes Absolute: 0.7 10*3/uL (ref 0.1–1.0)
Monocytes Relative: 8 %
Neutro Abs: 4.9 10*3/uL (ref 1.7–7.7)
Neutrophils Relative %: 55 %
Platelets: 345 10*3/uL (ref 150–400)
RBC: 4.47 MIL/uL (ref 3.87–5.11)
RDW: 13.5 % (ref 11.5–15.5)
WBC: 8.7 10*3/uL (ref 4.0–10.5)
nRBC: 0 % (ref 0.0–0.2)

## 2019-08-10 LAB — COMPREHENSIVE METABOLIC PANEL
ALT: 11 U/L (ref 0–44)
AST: 15 U/L (ref 15–41)
Albumin: 4 g/dL (ref 3.5–5.0)
Alkaline Phosphatase: 54 U/L (ref 38–126)
Anion gap: 10 (ref 5–15)
BUN: 28 mg/dL — ABNORMAL HIGH (ref 8–23)
CO2: 23 mmol/L (ref 22–32)
Calcium: 9.3 mg/dL (ref 8.9–10.3)
Chloride: 103 mmol/L (ref 98–111)
Creatinine, Ser: 1.27 mg/dL — ABNORMAL HIGH (ref 0.44–1.00)
GFR calc Af Amer: 44 mL/min — ABNORMAL LOW (ref >60)
GFR calc non Af Amer: 38 mL/min — ABNORMAL LOW (ref >60)
Glucose, Bld: 117 mg/dL — ABNORMAL HIGH (ref 70–99)
Potassium: 4 mmol/L (ref 3.5–5.1)
Sodium: 136 mmol/L (ref 135–145)
Total Bilirubin: 0.7 mg/dL (ref 0.3–1.2)
Total Protein: 7.5 g/dL (ref 6.5–8.1)

## 2019-08-10 LAB — URINALYSIS, COMPLETE (UACMP) WITH MICROSCOPIC
Bacteria, UA: NONE SEEN
Bilirubin Urine: NEGATIVE
Glucose, UA: NEGATIVE mg/dL
Hgb urine dipstick: NEGATIVE
Ketones, ur: NEGATIVE mg/dL
Leukocytes,Ua: NEGATIVE
Nitrite: NEGATIVE
Protein, ur: NEGATIVE mg/dL
Specific Gravity, Urine: 1.015 (ref 1.005–1.030)
pH: 5.5 (ref 5.0–8.0)

## 2019-08-10 LAB — TROPONIN I (HIGH SENSITIVITY)
Troponin I (High Sensitivity): 4 ng/L (ref <18)
Troponin I (High Sensitivity): 4 ng/L (ref <18)

## 2019-08-10 MED ORDER — SODIUM CHLORIDE 0.9 % IV BOLUS
1000.0000 mL | Freq: Once | INTRAVENOUS | Status: AC
  Administered 2019-08-10: 1000 mL via INTRAVENOUS

## 2019-08-10 NOTE — ED Triage Notes (Signed)
Pt to triage via w/c with no distress noted, mask in place; pt c/o mid back pain "for awhile"; st seen a urgent care 2wks ago for ?shingles but denies any rash; c/o "burning" sensation to each side of back "against bra line"; denies any accomp symptoms; pt denies any pain at all at present

## 2019-08-10 NOTE — ED Provider Notes (Signed)
[facility] Emergency Department Provider Note  ____________________________________________  Time seen: Approximately 5:58 AM  I have reviewed the triage vital signs and the nursing notes.   HISTORY  Chief Complaint Back Pain   HPI Claudia Friedman is a 86 y.o. female with a history of hypertension, anxiety, and shingles who presents for evaluation of back pain.  Patient reports that she has had back pain for several years.  She was told in the past that she had arthritis of the spine and she attributed the pain to that.  She reports the pain as a dull ache located between her shoulder blades in the back bilaterally.  The pain is intermittent and may come at rest and with movement.  At this time she has absolutely no pain.  She reports that she woke up in the middle of the night suddenly, she felt flushed and sweaty and did not feel right which prompted her to call 911.  She is not sure if she had the back pain when she woke up but she thinks she did.  She reports that her back was bothering her for most of the day yesterday.  She reports that she had a rash over her left shoulder blade 2 weeks ago and went to urgent care.  She was put on acyclovir for shingles.  The rash has resolved.  She denies any trauma to her back, chest pain or shortness of breath, cough or fever, abdominal pain, nausea, vomiting, diarrhea, dysuria or hematuria, dizziness.  She feels back to baseline at this time.   Past Medical History:  Diagnosis Date  . Anxiety   . History of echocardiogram    a. 12/19/15: EF 60-65%, no RWMA, nl LV diastolic fxn, mild AI, LA nl, PASP nl  . History of nuclear stress test    a. 12/19/15: no evidence of ischemia, EF 76%, low risk study  . Hypertension     Patient Active Problem List   Diagnosis Date Noted  . Chest pain with moderate risk for cardiac etiology 12/19/2015  . Uncontrolled hypertension 12/19/2015  . Anxiety 12/19/2015    Past Surgical  History:  Procedure Laterality Date  . CHOLECYSTECTOMY      Prior to Admission medications   Medication Sig Start Date End Date Taking? Authorizing Provider  ALPRAZolam (XANAX) 0.25 MG tablet Take 1 tablet (0.25 mg total) by mouth 2 (two) times daily as needed for anxiety. 12/19/15   Enid Baas, MD  aspirin EC 81 MG EC tablet Take 1 tablet (81 mg total) by mouth daily. 12/19/15   Enid Baas, MD  naproxen sodium (RA NAPROXEN SODIUM) 220 MG tablet Take 220-440 mg by mouth every 6 (six) hours as needed. For pain and sleep.    [provider]    Allergies Patient has no known allergies.  Family History  Problem Relation Age of Onset  . CAD Other   . Osteoporosis Other     Social History Social History   Tobacco Use  . Smoking status: Never Smoker  . Smokeless tobacco: Never Used  Substance Use Topics  . Alcohol use: Yes    Comment: occasional   . Drug use: No    Review of Systems  Constitutional: Negative for fever. Eyes: Negative for visual changes. ENT: Negative for sore throat. Neck: No neck pain  Cardiovascular: Negative for chest pain. Respiratory: Negative for shortness of breath. Gastrointestinal: Negative for abdominal pain, vomiting or diarrhea. Genitourinary: Negative for dysuria. Musculoskeletal: + back pain.  Skin: Negative for rash. Neurological: Negative for headaches, weakness or numbness. Psych: No SI or HI  ____________________________________________   PHYSICAL EXAM:  VITAL SIGNS: Vitals:   08/10/19 0604  BP: (!) 182/70  Pulse: 69  Resp: 16  SpO2: 100%    Constitutional: Alert and oriented. Well appearing and in no apparent distress. HEENT:      Head: Normocephalic and atraumatic.         Eyes: Conjunctivae are normal. Sclera is non-icteric.       Mouth/Throat: Mucous membranes are moist.       Neck: Supple with no signs of meningismus. Cardiovascular: Regular rate and rhythm. No murmurs, gallops, or rubs. 2+  symmetrical distal pulses are present in all extremities. No JVD. Chest wall: There is a large mobile mass over the L scapula which feels like a lipoma, no overlying skin changes or rashes, non tender, not associated with her breast.  Palpation of the chest wall does not produce any pain Respiratory: Normal respiratory effort. Lungs are clear to auscultation bilaterally. No wheezes, crackles, or rhonchi.  Gastrointestinal: Soft, non tender, and non distended with positive bowel sounds. No rebound or guarding. Genitourinary: No CVA tenderness. Musculoskeletal: Nontender with normal range of motion in all extremities. No edema, cyanosis, or erythema of extremities. Neurologic: Normal speech and language. Face is symmetric. Moving all extremities. No gross focal neurologic deficits are appreciated. Skin: Skin is warm, dry and intact. No rash noted. Psychiatric: Mood and affect are normal. Speech and behavior are normal.  ____________________________________________   LABS (all labs ordered are listed, but only abnormal results are displayed)  Labs Reviewed  COMPREHENSIVE METABOLIC PANEL - Abnormal; Notable for the following components:      Result Value   Glucose, Bld 117 (*)    BUN 28 (*)    Creatinine, Ser 1.27 (*)    GFR calc non Af Amer 38 (*)    GFR calc Af Amer 44 (*)    All other components within normal limits  CBC WITH DIFFERENTIAL/PLATELET  URINALYSIS, COMPLETE (UACMP) WITH MICROSCOPIC  TROPONIN I (HIGH SENSITIVITY)  TROPONIN I (HIGH SENSITIVITY)   ____________________________________________  EKG  ED ECG REPORT I, Nita Sickle, the attending physician, personally viewed and interpreted this ECG.  Normal sinus rhythm, rate of 74, normal intervals, normal axis, low voltage QRS, T wave inversions in inferior leads with no ST elevation.  Unchanged from prior. ____________________________________________  RADIOLOGY  I have personally reviewed the images performed  during this visit and I agree with the Radiologist's read.   Interpretation by Radiologist:  CT Chest Wo Contrast  Result Date: 08/10/2019 CLINICAL DATA:  Palpable mass in the left scapular area. Mid back pain. EXAM: CT CHEST WITHOUT CONTRAST TECHNIQUE: Multidetector CT imaging of the chest was performed following the standard protocol without IV contrast. COMPARISON:  And chest CT 12/18/2015 FINDINGS: Cardiovascular: The heart is normal in size for age. There is a pectus deformity and mild mass effect on the right ventricle. No pericardial effusion. There are moderate scattered aortic and coronary artery calcifications. Mediastinum/Nodes: No mediastinal or hilar mass or adenopathy. The esophagus is grossly normal. The thyroid gland is unremarkable. Lungs/Pleura: No acute pulmonary findings. No infiltrates, edema or pulmonary nodules. No worrisome pulmonary lesions. Streaky basilar scarring changes or atelectasis. Upper Abdomen: No significant upper abdominal findings. Suspect left-sided parapelvic renal cyst similar to prior. Musculoskeletal: Significant scoliosis and mild pectus deformity. No acute bony findings. Moderate degenerative changes involving the thoracic spine with exaggerated thoracic kyphosis.  Moderate thoracic facet disease also. No evidence of acute compression fracture. The sternum is intact and the ribs are intact. The patient's palpable abnormality corresponds to a benign-appearing subcutaneous lipoma superficial to the scapula. This measures a maximum of 10 cm and was also present on the prior CT in 2017. No worrisome CT imaging features are identified. No breast masses, supraclavicular or axillary adenopathy. IMPRESSION: 1. The patient's palpable abnormality corresponds to a benign-appearing subcutaneous lipoma superficial to the scapula. No worrisome CT imaging features. No change since 2017. 2. No acute pulmonary findings or worrisome pulmonary lesions. 3. No mediastinal or hilar mass or  adenopathy. 4. Scoliosis and degenerative changes involving the spine but no acute bony findings. Aortic Atherosclerosis (ICD10-I70.0). Aortic Atherosclerosis (ICD10-I70.0). Electronically Signed   By: Rudie Meyer M.D.   On: 08/10/2019 05:45    ____________________________________________   PROCEDURES  Procedure(s) performed: None Procedures Critical Care performed:  None ____________________________________________   INITIAL IMPRESSION / ASSESSMENT AND PLAN / ED COURSE  86 y.o. female with a history of hypertension, anxiety, and shingles who presents for evaluation of intermittent back pain for several years.  On exam patient is well-appearing and in no distress, she has normal vital signs, normal work of breathing normal sats, lungs are clear to auscultation, heart regular rate and rhythm, palpation of the chest wall elicits no discomfort, palpation of CT no spine also with no tenderness.  Patient does have a large round mobile mass over the left scapula which feels like a lipoma.  Patient is asymptomatic at this time.  Ddx DJD vs muscle spasms vs shingles vs compression fracture. Atypical for ACS.  No tachycardia, tachypnea, hypoxia, and intermittent symptoms several years therefore less likely to be a dissection or PE.  As part of my medical decision making, I reviewed the following data within the electronic MEDICAL RECORD NUMBERCT was done to better evaluate mass which is consistent with a lipoma.  She has scoliosis and arthritis of the spine but no acute abnormalities found on CT. I have  reviewed the EKG and looked at the rhythm strip in the room which showed NSR. I have reviewed patient's previous medical records and PMH. Labs were reviewed by me and showed no leukocytosis, no anemia, slightly increase in the creatinine, HS-trop x 2 negative.  Given IVF for mild dehydration. No pain meds given as patient is pain free now.    Discussed my standard return precautions and follow-up with  PCP.  Presentation is most likely due to muscle spasms versus DJD.     _____________________________________________ Please note:  Patient was evaluated in Emergency Department today for the symptoms described in the history of present illness. Patient was evaluated in the context of the global COVID-19 pandemic, which necessitated consideration that the patient might be at risk for infection with the SARS-CoV-2 virus that causes COVID-19. Institutional protocols and algorithms that pertain to the evaluation of patients at risk for COVID-19 are in a state of rapid change based on information released by regulatory bodies including the CDC and federal and state organizations. These policies and algorithms were followed during the patient's care in the ED.  Some ED evaluations and interventions may be delayed as a result of limited staffing during the pandemic.   ____________________________________________   FINAL CLINICAL IMPRESSION(S) / ED DIAGNOSES   Final diagnoses:  Chronic midline thoracic back pain  Arthritis      NEW MEDICATIONS STARTED DURING THIS VISIT:  ED Discharge Orders    None  Note:  This document was prepared using Dragon voice recognition software and may include unintentional dictation errors.    Alfred Levins, [place], MD 08/10/19 [phone]

## 2020-10-07 ENCOUNTER — Inpatient Hospital Stay
Admission: EM | Admit: 2020-10-07 | Discharge: 2020-10-11 | DRG: 378 | Disposition: A | Discharge status: Home Health | Payer: Medicare Other | Attending: Family Medicine | Admitting: Family Medicine

## 2020-10-07 ENCOUNTER — Emergency Department: Payer: Medicare Other

## 2020-10-07 ENCOUNTER — Other Ambulatory Visit: Payer: Self-pay

## 2020-10-07 DIAGNOSIS — K922 Gastrointestinal hemorrhage, unspecified: Secondary | ICD-10-CM

## 2020-10-07 DIAGNOSIS — D509 Iron deficiency anemia, unspecified: Secondary | ICD-10-CM | POA: Diagnosis present

## 2020-10-07 DIAGNOSIS — Z8262 Family history of osteoporosis: Secondary | ICD-10-CM

## 2020-10-07 DIAGNOSIS — R55 Syncope and collapse: Secondary | ICD-10-CM | POA: Diagnosis present

## 2020-10-07 DIAGNOSIS — Z8249 Family history of ischemic heart disease and other diseases of the circulatory system: Secondary | ICD-10-CM

## 2020-10-07 DIAGNOSIS — N1831 Chronic kidney disease, stage 3a: Secondary | ICD-10-CM | POA: Diagnosis present

## 2020-10-07 DIAGNOSIS — I129 Hypertensive chronic kidney disease with stage 1 through stage 4 chronic kidney disease, or unspecified chronic kidney disease: Secondary | ICD-10-CM | POA: Diagnosis present

## 2020-10-07 DIAGNOSIS — E538 Deficiency of other specified B group vitamins: Secondary | ICD-10-CM | POA: Diagnosis present

## 2020-10-07 DIAGNOSIS — Z79899 Other long term (current) drug therapy: Secondary | ICD-10-CM

## 2020-10-07 DIAGNOSIS — D649 Anemia, unspecified: Secondary | ICD-10-CM | POA: Diagnosis not present

## 2020-10-07 DIAGNOSIS — K222 Esophageal obstruction: Secondary | ICD-10-CM | POA: Diagnosis present

## 2020-10-07 DIAGNOSIS — D519 Vitamin B12 deficiency anemia, unspecified: Secondary | ICD-10-CM | POA: Diagnosis not present

## 2020-10-07 DIAGNOSIS — Z7982 Long term (current) use of aspirin: Secondary | ICD-10-CM | POA: Diagnosis not present

## 2020-10-07 DIAGNOSIS — D62 Acute posthemorrhagic anemia: Secondary | ICD-10-CM | POA: Diagnosis present

## 2020-10-07 DIAGNOSIS — R195 Other fecal abnormalities: Secondary | ICD-10-CM

## 2020-10-07 DIAGNOSIS — I10 Essential (primary) hypertension: Secondary | ICD-10-CM | POA: Diagnosis not present

## 2020-10-07 DIAGNOSIS — F419 Anxiety disorder, unspecified: Secondary | ICD-10-CM | POA: Diagnosis present

## 2020-10-07 DIAGNOSIS — K5731 Diverticulosis of large intestine without perforation or abscess with bleeding: Secondary | ICD-10-CM | POA: Diagnosis present

## 2020-10-07 DIAGNOSIS — Z66 Do not resuscitate: Secondary | ICD-10-CM | POA: Diagnosis present

## 2020-10-07 DIAGNOSIS — Z20822 Contact with and (suspected) exposure to covid-19: Secondary | ICD-10-CM | POA: Diagnosis present

## 2020-10-07 DIAGNOSIS — N179 Acute kidney failure, unspecified: Secondary | ICD-10-CM | POA: Diagnosis present

## 2020-10-07 LAB — CBC
HCT: 22.3 % — ABNORMAL LOW (ref 36.0–46.0)
Hemoglobin: 6.5 g/dL — ABNORMAL LOW (ref 12.0–15.0)
MCH: 22.5 pg — ABNORMAL LOW (ref 26.0–34.0)
MCHC: 29.1 g/dL — ABNORMAL LOW (ref 30.0–36.0)
MCV: 77.2 fL — ABNORMAL LOW (ref 80.0–100.0)
Platelets: 435 10*3/uL — ABNORMAL HIGH (ref 150–400)
RBC: 2.89 MIL/uL — ABNORMAL LOW (ref 3.87–5.11)
RDW: 17.2 % — ABNORMAL HIGH (ref 11.5–15.5)
WBC: 10.8 10*3/uL — ABNORMAL HIGH (ref 4.0–10.5)
nRBC: 0 % (ref 0.0–0.2)

## 2020-10-07 LAB — IRON AND TIBC
Iron: 14 ug/dL — ABNORMAL LOW (ref 28–170)
Saturation Ratios: 3 % — ABNORMAL LOW (ref 10.4–31.8)
TIBC: 483 ug/dL — ABNORMAL HIGH (ref 250–450)
UIBC: 469 ug/dL

## 2020-10-07 LAB — PREPARE RBC (CROSSMATCH)

## 2020-10-07 LAB — BASIC METABOLIC PANEL
Anion gap: 10 (ref 5–15)
BUN: 39 mg/dL — ABNORMAL HIGH (ref 8–23)
CO2: 21 mmol/L — ABNORMAL LOW (ref 22–32)
Calcium: 9 mg/dL (ref 8.9–10.3)
Chloride: 104 mmol/L (ref 98–111)
Creatinine, Ser: 1.77 mg/dL — ABNORMAL HIGH (ref 0.44–1.00)
GFR, Estimated: 27 mL/min — ABNORMAL LOW (ref >60)
Glucose, Bld: 123 mg/dL — ABNORMAL HIGH (ref 70–99)
Potassium: 4.2 mmol/L (ref 3.5–5.1)
Sodium: 135 mmol/L (ref 135–145)

## 2020-10-07 LAB — FERRITIN: Ferritin: 5 ng/mL — ABNORMAL LOW (ref 11–307)

## 2020-10-07 LAB — RETICULOCYTES
Immature Retic Fract: 32.6 % — ABNORMAL HIGH (ref 2.3–15.9)
RBC.: 2.85 MIL/uL — ABNORMAL LOW (ref 3.87–5.11)
Retic Count, Absolute: 55 10*3/uL (ref 19.0–186.0)
Retic Ct Pct: 1.9 % (ref 0.4–3.1)

## 2020-10-07 LAB — FOLATE: Folate: 17.3 ng/mL (ref >5.9)

## 2020-10-07 LAB — PROTIME-INR
INR: 1.1 (ref 0.8–1.2)
Prothrombin Time: 13.7 seconds (ref 11.4–15.2)

## 2020-10-07 LAB — ABO/RH: ABO/RH(D): O NEG

## 2020-10-07 LAB — HEMOGLOBIN AND HEMATOCRIT, BLOOD
HCT: 22 % — ABNORMAL LOW (ref 36.0–46.0)
Hemoglobin: 6.5 g/dL — ABNORMAL LOW (ref 12.0–15.0)

## 2020-10-07 MED ORDER — SODIUM CHLORIDE 0.9 % IV SOLN: INTRAVENOUS | Status: DC

## 2020-10-07 MED ORDER — ALPRAZOLAM 0.5 MG PO TABS
0.2500 mg | ORAL_TABLET | Freq: Two times a day (BID) | ORAL | Status: DC | PRN
  Administered 2020-10-08 – 2020-10-10 (×3): 0.25 mg via ORAL
  Filled 2020-10-07 (×3): qty 1

## 2020-10-07 MED ORDER — PANTOPRAZOLE SODIUM 40 MG IV SOLR
40.0000 mg | Freq: Once | INTRAVENOUS | Status: AC
  Administered 2020-10-07: 40 mg via INTRAVENOUS
  Filled 2020-10-07: qty 40

## 2020-10-07 MED ORDER — ACETAMINOPHEN 325 MG PO TABS: 650.0000 mg | ORAL_TABLET | Freq: Four times a day (QID) | ORAL | Status: DC | PRN

## 2020-10-07 MED ORDER — ONDANSETRON HCL 4 MG/2ML IJ SOLN: 4.0000 mg | Freq: Four times a day (QID) | INTRAMUSCULAR | Status: DC | PRN

## 2020-10-07 MED ORDER — SODIUM CHLORIDE 0.9 % IV SOLN
8.0000 mg/h | INTRAVENOUS | Status: DC
  Administered 2020-10-07 – 2020-10-08 (×3): 8 mg/h via INTRAVENOUS
  Filled 2020-10-07 (×4): qty 80

## 2020-10-07 MED ORDER — SODIUM CHLORIDE 0.9% IV SOLUTION
Freq: Once | INTRAVENOUS | Status: AC
  Filled 2020-10-07: qty 250

## 2020-10-07 MED ORDER — PANTOPRAZOLE SODIUM 40 MG IV SOLR: 40.0000 mg | Freq: Two times a day (BID) | INTRAVENOUS | Status: DC

## 2020-10-07 MED ORDER — ACETAMINOPHEN 650 MG RE SUPP: 650.0000 mg | Freq: Four times a day (QID) | RECTAL | Status: DC | PRN

## 2020-10-07 MED ORDER — ONDANSETRON HCL 4 MG PO TABS: 4.0000 mg | ORAL_TABLET | Freq: Four times a day (QID) | ORAL | Status: DC | PRN

## 2020-10-07 MED ORDER — TRAZODONE HCL 50 MG PO TABS
25.0000 mg | ORAL_TABLET | Freq: Every evening | ORAL | Status: DC | PRN
  Administered 2020-10-10: 25 mg via ORAL
  Filled 2020-10-07: qty 1

## 2020-10-07 NOTE — ED Notes (Signed)
First nurse note: pt comes EMS from home (alone) and has been having "dizzy spells" for about a month. Has seen cardiology in the past but is not on any meds for the dizziness. Pt afib on EMS monitor. Has NOT fallen. No pain, but does have generalized weakness. Some swelling to lower legs also noted. VSS.

## 2020-10-07 NOTE — ED Notes (Signed)
Patient provided with crackers and drink per OK from Dr. Erma Heritage

## 2020-10-07 NOTE — ED Notes (Signed)
Patient transported to CT 

## 2020-10-07 NOTE — H&P (Addendum)
[place]   PATIENT NAME: Claudia Friedman    MR#:  [id number]  DATE OF BIRTH:  09-Feb-1931  DATE OF ADMISSION:  10/07/2020  PRIMARY CARE PHYSICIAN: [facility]   Patient is coming from: [place]  REQUESTING/REFERRING PHYSICIAN: Cuthriell, Delorise Royals, PA-C  CHIEF COMPLAINT:   Chief Complaint  Patient presents with  . Dizziness    HISTORY OF PRESENT ILLNESS:  Claudia Friedman is a 87 y.o. Caucasian female with medical history significant for essential hypertension and anxiety, who presents to the emergency room with acute onset of generalized weakness and lightheadedness and dizziness which have been worsening over the last month.  She stated that she is currently not able to walk without holding to furniture.  She denies any dyspnea on exertion or chest pain or palpitations.  No nausea or vomiting or heartburn or abdominal pain.  No melena or bright red bleeding per rectum.  She stated that she has problems with depth perception sometimes.  No dysuria, oliguria or hematuria or flank pain.  ED Course: Upon presentation to the ER vital signs were within normal.  Blood pressure later on was 93/53 and improved.  Labs revealed a hemoglobin of 6.5 and hematocrit 22.3 with low RBC indices and platelets of 435.  Further work-up showed low serum ferritin of 5 and iron saturation ratio with elevated TIBC of 483 and low serum iron of 14 with normal folate of 17.3.  Stool occult blood came back positive.   EKG as reviewed by me :  showed normal sinus rhythm with a rate of 79 Imaging: Noncontrasted head CT scan revealed chronic atrophic and ischemic changes without acute abnormality.  The patient was typed and crossmatched and will be transfused units of packed red blood cells.  When stable after the first unit of packed red blood cells, she will be admitted to a medical monitored bed for further evaluation and management. PAST MEDICAL HISTORY:   Past Medical History:  Diagnosis Date  .  Anxiety   . History of echocardiogram    a. 12/19/15: EF 60-65%, no RWMA, nl LV diastolic fxn, mild AI, LA nl, PASP nl  . History of nuclear stress test    a. 12/19/15: no evidence of ischemia, EF 76%, low risk study  . Hypertension     PAST SURGICAL HISTORY:   Past Surgical History:  Procedure Laterality Date  . CHOLECYSTECTOMY      SOCIAL HISTORY:   Social History   Tobacco Use  . Smoking status: Never Smoker  . Smokeless tobacco: Never Used  Substance Use Topics  . Alcohol use: Yes    Comment: occasional     FAMILY HISTORY:   Family History  Problem Relation Age of Onset  . CAD Other   . Osteoporosis Other     DRUG ALLERGIES:  No Known Allergies  REVIEW OF SYSTEMS:   ROS As per history of present illness. All pertinent systems were reviewed above. Constitutional, HEENT, cardiovascular, respiratory, GI, GU, musculoskeletal, neuro, psychiatric, endocrine, integumentary and hematologic systems were reviewed and are otherwise negative/unremarkable except for positive findings mentioned above in the HPI.   MEDICATIONS AT HOME:   Prior to Admission medications   Medication Sig Start Date End Date Taking? Authorizing Provider  acetaminophen (TYLENOL) 500 MG tablet Take 500 mg by mouth every 6 (six) hours as needed.    [provider]  ALPRAZolam Prudy Feeler) 0.25 MG tablet Take 1 tablet (0.25 mg total) by mouth 2 (two) times  daily as needed for anxiety. 12/19/15   Enid Baas, MD  aspirin EC 81 MG EC tablet Take 1 tablet (81 mg total) by mouth daily. 12/19/15   Enid Baas, MD  escitalopram (LEXAPRO) 5 MG tablet Take 5 mg by mouth daily. 07/20/20   [provider]  hydrochlorothiazide (HYDRODIURIL) 12.5 MG tablet Take 12.5 mg by mouth daily. 09/06/20   [provider]  lisinopril (ZESTRIL) 2.5 MG tablet Take 2.5 mg by mouth daily. 09/14/20   [provider]  naproxen sodium (RA NAPROXEN SODIUM) 220 MG tablet Take 220-440 mg by  mouth every 6 (six) hours as needed. For pain and sleep.    [provider]      VITAL SIGNS:  Blood pressure (!) 99/44, pulse 89, temperature 98.5 F (36.9 C), temperature source Oral, resp. rate 16, height 5\' 6"  (1.676 m), weight 78.5 kg, SpO2 100 %.  PHYSICAL EXAMINATION:  Physical Exam  GENERAL:  87 y.o.-year-old Caucasian female patient lying in the bed with no acute distress.  EYES: Pupils equal, round, reactive to light and accommodation. No scleral icterus.  Positive pallor.  Extraocular muscles intact.  HEENT: Head atraumatic, normocephalic. Oropharynx and nasopharynx clear.  NECK:  Supple, no jugular venous distention. No thyroid enlargement, no tenderness.  LUNGS: Normal breath sounds bilaterally, no wheezing, rales,rhonchi or crepitation. No use of accessory muscles of respiration.  CARDIOVASCULAR: Regular rate and rhythm, S1, S2 normal. No murmurs, rubs, or gallops.  ABDOMEN: Soft, nondistended, nontender. Bowel sounds present. No organomegaly or mass.  EXTREMITIES: No pedal edema, cyanosis, or clubbing.  NEUROLOGIC: Cranial nerves II through XII are intact. Muscle strength 5/5 in all extremities. Sensation intact. Gait not checked.  PSYCHIATRIC: The patient is alert and oriented x 3.  Normal affect and good eye contact. SKIN: No obvious rash, lesion, or ulcer.   LABORATORY PANEL:   CBC Recent Labs  Lab 10/07/20 1523  WBC 10.8*  HGB 6.5*  HCT 22.3*  PLT 435*   ------------------------------------------------------------------------------------------------------------------  Chemistries  Recent Labs  Lab 10/07/20 1523  NA 135  K 4.2  CL 104  CO2 21*  GLUCOSE 123*  BUN 39*  CREATININE 1.77*  CALCIUM 9.0   ------------------------------------------------------------------------------------------------------------------  Cardiac Enzymes No results for input(s): TROPONINI in the last 168  hours. ------------------------------------------------------------------------------------------------------------------  RADIOLOGY:  CT Head Wo Contrast  Result Date: 10/07/2020 CLINICAL DATA:  Increasing dizziness EXAM: CT HEAD WITHOUT CONTRAST TECHNIQUE: Contiguous axial images were obtained from the base of the skull through the vertex without intravenous contrast. COMPARISON:  None. FINDINGS: Brain: No evidence of acute infarction, hemorrhage, hydrocephalus, extra-axial collection or mass lesion/mass effect. Chronic atrophic and ischemic changes are noted. Vascular: No hyperdense vessel or unexpected calcification. Skull: Normal. Negative for fracture or focal lesion. Sinuses/Orbits: No acute finding. Other: None. IMPRESSION: Chronic atrophic and ischemic changes without acute abnormality. Electronically Signed   By: 12/07/2020 M.D.   On: 10/07/2020 19:54      IMPRESSION AND PLAN:  Active Problems:   Acute blood loss anemia  1.  GI bleeding with subsequent acute blood loss anemia. - The patient will be admitted to a medical monitored bed. - The patient will receive 2 units of packed red blood cells. - We will follow serial hemoglobins and hematocrits. - We will check posttransfusion H&H. - We will stop NSAIDs. - The patient will be given IV Protonix bolus and drip. - GI consultation will be obtained. - I notified Dr.Locklear about the patient.  2.  Acute kidney  injury. - The patient will be placed on IV hydration and will follow BMP. - We will hold off nephrotoxins.  3.  Essential hypertension. - We will continue Zestril and hold off HCTZ.  4.  Anxiety disorder - We will continue Lexapro.  DVT prophylaxis: SCDs.  Medical prophylaxis currently contraindicated due to GI bleeding. Code Status: The patient is DNR/DNI.  This was discussed with her. Family Communication:  The plan of care was discussed in details with the patient (and family). I answered all questions. The  patient agreed to proceed with the above mentioned plan. Further management will depend upon hospital course. Disposition Plan: Back to previous home environment Consults called: Gastroenterology consult. All the records are reviewed and case discussed with ED provider.  Status is: Inpatient  Remains inpatient appropriate because:Ongoing diagnostic testing needed not appropriate for outpatient work up, Unsafe d/c plan, IV treatments appropriate due to intensity of illness or inability to take PO and Inpatient level of care appropriate due to severity of illness   Dispo: The patient is from: Home              Anticipated d/c is to: Home              Patient currently is not medically stable to d/c.   Difficult to place patient No   TOTAL TIME TAKING CARE OF THIS PATIENT: 55 minutes.    Hannah Beat M.D on 10/07/2020 at 9:00 PM  [facility]   From 7 PM-7 AM, contact night-coverage [email or web address]  CC: Primary care physician; [facility]

## 2020-10-07 NOTE — ED Triage Notes (Signed)
Pt states she has a hx of vertigo and over the last month has become increasingly dizzy- pt lives alone and has to have chairs set up around the house to prevent herself from falling

## 2020-10-07 NOTE — ED Provider Notes (Signed)
[facility] Emergency Department Provider Note  ____________________________________________  Time seen: Approximately 6:39 PM  I have reviewed the triage vital signs and the nursing notes.   HISTORY  Chief Complaint Dizziness    HPI Claudia Friedman is a 87 y.o. female who presents emergency department complaining of a month of progressive weakness.  Patient arrived complaining of "vertigo."  Patient states that the first week was "really bad and I do not remember much from it."  Patient states that after that she has had a slow progression of symptoms.  She states that at this point she is too weak to get off of the couch or chair to even go to the kitchen to feed her self.   Patient states that she will want to go eat, want to go to another room but the fatigue is so great that she does not have the "energy to do it."  Patient states that when she does get up she feels like she will fall at any second.  She states that she is strategically placed furniture about the house so that she can always be holding onto a piece of furniture as she tries to go from one room to the next.  Patient denies any headache, visual changes, unilateral weakness, chest pain, shortness of breath, abdominal pain.  She is denied any bloody or dark tarry stools.  No urinary complaints.  Patient with a history of anxiety and hypertension.  No medications for this complaint prior to arrival.        Past Medical History:  Diagnosis Date  . Anxiety   . History of echocardiogram    a. 12/19/15: EF 60-65%, no RWMA, nl LV diastolic fxn, mild AI, LA nl, PASP nl  . History of nuclear stress test    a. 12/19/15: no evidence of ischemia, EF 76%, low risk study  . Hypertension     Patient Active Problem List   Diagnosis Date Noted  . Acute blood loss anemia 10/07/2020  . Chest pain with moderate risk for cardiac etiology 12/19/2015  . Uncontrolled hypertension 12/19/2015  . Anxiety 12/19/2015     Past Surgical History:  Procedure Laterality Date  . CHOLECYSTECTOMY      Prior to Admission medications   Medication Sig Start Date End Date Taking? Authorizing Provider  ALPRAZolam (XANAX) 0.25 MG tablet Take 1 tablet (0.25 mg total) by mouth 2 (two) times daily as needed for anxiety. 12/19/15   Enid Baas, MD  aspirin EC 81 MG EC tablet Take 1 tablet (81 mg total) by mouth daily. 12/19/15   Enid Baas, MD  naproxen sodium (RA NAPROXEN SODIUM) 220 MG tablet Take 220-440 mg by mouth every 6 (six) hours as needed. For pain and sleep.    [provider]    Allergies Patient has no known allergies.  Family History  Problem Relation Age of Onset  . CAD Other   . Osteoporosis Other     Social History Social History   Tobacco Use  . Smoking status: Never Smoker  . Smokeless tobacco: Never Used  Substance Use Topics  . Alcohol use: Yes    Comment: occasional   . Drug use: No     Review of Systems  Constitutional: No fever/chills.  Positive for generalized weakness Eyes: No visual changes. No discharge ENT: No upper respiratory complaints. Cardiovascular: no chest pain. Respiratory: no cough. No SOB. Gastrointestinal: No abdominal pain.  No nausea, no vomiting.  No diarrhea.  No constipation. Genitourinary:  Negative for dysuria. No hematuria Musculoskeletal: Negative for musculoskeletal pain. Skin: Negative for rash, abrasions, lacerations, ecchymosis. Neurological: Negative for headaches, focal weakness or numbness.  10 System ROS otherwise negative.  ____________________________________________   PHYSICAL EXAM:  VITAL SIGNS: ED Triage Vitals  Enc Vitals Group     BP 10/07/20 1518 (!) 93/53     Pulse Rate 10/07/20 1513 77     Resp 10/07/20 1513 18     Temp 10/07/20 1513 98.2 F (36.8 C)     Temp Source 10/07/20 1513 Oral     SpO2 10/07/20 1513 100 %     Weight 10/07/20 1515 173 lb (78.5 kg)     Height 10/07/20 1515 5\' 6"  (1.676 m)      Head Circumference --      Peak Flow --      Pain Score 10/07/20 1515 0     Pain Loc --      Pain Edu? --      Excl. in GC? --      Constitutional: Alert and oriented. Well appearing and in no acute distress. Eyes: Conjunctivae are normal. PERRL. EOMI. Head: Atraumatic. ENT:      Ears:       Nose: No congestion/rhinnorhea.      Mouth/Throat: Mucous membranes are moist.  Neck: No stridor.  Hematological/Lymphatic/Immunilogical: No cervical lymphadenopathy. Cardiovascular: Normal rate, regular rhythm. Normal S1 and S2.  Good peripheral circulation. Respiratory: Normal respiratory effort without tachypnea or retractions. Lungs CTAB. Good air entry to the bases with no decreased or absent breath sounds. Gastrointestinal: Bowel sounds 4 quadrants. Soft and nontender to palpation. No guarding or rigidity. No palpable masses. No distention. No CVA tenderness.  Rectal exam reveals positive guaiac testing.  Rectal exam did not reveal any frank blood or dark stool. Musculoskeletal: Full range of motion to all extremities. No gross deformities appreciated. Neurologic:  Normal speech and language. No gross focal neurologic deficits are appreciated.  Cranial nerves II through XII grossly intact.  Specifically no nystagmus, equal grip strength, negative pronator drift and Romberg's. Skin:  Skin is warm, dry and intact. No rash noted. Psychiatric: Mood and affect are normal. Speech and behavior are normal. Patient exhibits appropriate insight and judgement.   ____________________________________________   LABS (all labs ordered are listed, but only abnormal results are displayed)  Labs Reviewed  BASIC METABOLIC PANEL - Abnormal; Notable for the following components:      Result Value   CO2 21 (*)    Glucose, Bld 123 (*)    BUN 39 (*)    Creatinine, Ser 1.77 (*)    GFR, Estimated 27 (*)    All other components within normal limits  CBC - Abnormal; Notable for the following components:    WBC 10.8 (*)    RBC 2.89 (*)    Hemoglobin 6.5 (*)    HCT 22.3 (*)    MCV 77.2 (*)    MCH 22.5 (*)    MCHC 29.1 (*)    RDW 17.2 (*)    Platelets 435 (*)    All other components within normal limits  PROTIME-INR  URINALYSIS, COMPLETE (UACMP) WITH MICROSCOPIC  IRON AND TIBC  FOLATE  FERRITIN  VITAMIN B12  RETICULOCYTES  TYPE AND SCREEN  PREPARE RBC (CROSSMATCH)  ABO/RH   ____________________________________________  EKG   ____________________________________________  RADIOLOGY I personally viewed and evaluated these images as part of my medical decision making, as well as reviewing the written report by the radiologist.  ED Provider Interpretation: No acute finding on CT head  CT Head Wo Contrast  Result Date: 10/07/2020 CLINICAL DATA:  Increasing dizziness EXAM: CT HEAD WITHOUT CONTRAST TECHNIQUE: Contiguous axial images were obtained from the base of the skull through the vertex without intravenous contrast. COMPARISON:  None. FINDINGS: Brain: No evidence of acute infarction, hemorrhage, hydrocephalus, extra-axial collection or mass lesion/mass effect. Chronic atrophic and ischemic changes are noted. Vascular: No hyperdense vessel or unexpected calcification. Skull: Normal. Negative for fracture or focal lesion. Sinuses/Orbits: No acute finding. Other: None. IMPRESSION: Chronic atrophic and ischemic changes without acute abnormality. Electronically Signed   By: Alcide Clever M.D.   On: 10/07/2020 19:54    ____________________________________________    PROCEDURES  Procedure(s) performed:    Procedures    Medications  pantoprazole (PROTONIX) 80 mg in sodium chloride 0.9 % 100 mL (0.8 mg/mL) infusion (has no administration in time range)  pantoprazole (PROTONIX) injection 40 mg (has no administration in time range)  pantoprazole (PROTONIX) injection 40 mg (has no administration in time range)  0.9 %  sodium chloride infusion (Manually program via Guardrails IV  Fluids) ( Intravenous New Bag/Given 10/07/20 1957)  pantoprazole (PROTONIX) injection 40 mg (40 mg Intravenous Given 10/07/20 1956)     ____________________________________________   INITIAL IMPRESSION / ASSESSMENT AND PLAN / ED COURSE  Pertinent labs & imaging results that were available during my care of the patient were reviewed by me and considered in my medical decision making (see chart for details).  Review of the [place] CSRS was performed in accordance of the [facility] prior to dispensing any controlled drugs.           Patient's diagnosis is consistent with symptomatic anemia.  Patient presented to the emergency department for complaint of progressive weakness over the past month.  Patient initially described herself as having vertigo, however on clarification patient has no spinning or dizzy sensation, is having progressive weakness.  This is not unilateral.  Again approximately a month of symptoms.  Patient is progressed to the point where she is too weak to even get up to go fix her self something to eat.  She states that she is hungry but does not have the energy to go to the kitchen to prepare anything.  She states that she cannot walk to the house without holding onto a piece of furniture.  She lives alone and this has been a significant change for her.  Overall exam was reassuring.  She was guaiac positive on rectal exam.  Hemoglobin of 6.5.  At this point patient is symptomatic, does have evidence that she has a blood loss in the GI system.  No active signs of hemorrhage.  Patient is given Protonix, initiated blood transfusion and patient will be admitted to the hospitalist service.     ____________________________________________  FINAL CLINICAL IMPRESSION(S) / ED DIAGNOSES  Final diagnoses:  Symptomatic anemia  Guaiac positive stools      NEW MEDICATIONS STARTED DURING THIS VISIT:  ED Discharge Orders    None          This chart was dictated using voice recognition  software/Dragon. Despite best efforts to proofread, errors can occur which can change the meaning. Any change was purely unintentional.    Lanette Hampshire 10/07/20 2044    Shaune Pollack, MD 10/08/20 [place]    Shaune Pollack, MD 11/02/20 [phone]

## 2020-10-08 LAB — TYPE AND SCREEN
ABO/RH(D): O NEG
Antibody Screen: NEGATIVE
Unit division: 0
Unit division: 0

## 2020-10-08 LAB — BASIC METABOLIC PANEL
Anion gap: 9 (ref 5–15)
BUN: 37 mg/dL — ABNORMAL HIGH (ref 8–23)
CO2: 22 mmol/L (ref 22–32)
Calcium: 8.5 mg/dL — ABNORMAL LOW (ref 8.9–10.3)
Chloride: 107 mmol/L (ref 98–111)
Creatinine, Ser: 1.61 mg/dL — ABNORMAL HIGH (ref 0.44–1.00)
GFR, Estimated: 31 mL/min — ABNORMAL LOW (ref >60)
Glucose, Bld: 88 mg/dL (ref 70–99)
Potassium: 3.6 mmol/L (ref 3.5–5.1)
Sodium: 138 mmol/L (ref 135–145)

## 2020-10-08 LAB — HEMOGLOBIN AND HEMATOCRIT, BLOOD
HCT: 27.5 % — ABNORMAL LOW (ref 36.0–46.0)
HCT: 28.4 % — ABNORMAL LOW (ref 36.0–46.0)
Hemoglobin: 8.6 g/dL — ABNORMAL LOW (ref 12.0–15.0)
Hemoglobin: 8.8 g/dL — ABNORMAL LOW (ref 12.0–15.0)

## 2020-10-08 LAB — BPAM RBC
Blood Product Expiration Date: 202206032359
Blood Product Expiration Date: 202206042359
ISSUE DATE / TIME: 202205072035
ISSUE DATE / TIME: 202205072214
Unit Type and Rh: 5100
Unit Type and Rh: 5100

## 2020-10-08 LAB — CBC
HCT: 27.4 % — ABNORMAL LOW (ref 36.0–46.0)
Hemoglobin: 8.5 g/dL — ABNORMAL LOW (ref 12.0–15.0)
MCH: 24.4 pg — ABNORMAL LOW (ref 26.0–34.0)
MCHC: 31 g/dL (ref 30.0–36.0)
MCV: 78.7 fL — ABNORMAL LOW (ref 80.0–100.0)
Platelets: 347 10*3/uL (ref 150–400)
RBC: 3.48 MIL/uL — ABNORMAL LOW (ref 3.87–5.11)
RDW: 17 % — ABNORMAL HIGH (ref 11.5–15.5)
WBC: 9.2 10*3/uL (ref 4.0–10.5)
nRBC: 0 % (ref 0.0–0.2)

## 2020-10-08 LAB — VITAMIN B12: Vitamin B-12: 114 pg/mL — ABNORMAL LOW (ref 180–914)

## 2020-10-08 LAB — SARS CORONAVIRUS 2 (TAT 6-24 HRS): SARS Coronavirus 2: NEGATIVE

## 2020-10-08 MED ORDER — CYANOCOBALAMIN 1000 MCG/ML IJ SOLN
1000.0000 ug | Freq: Every day | INTRAMUSCULAR | Status: DC
  Administered 2020-10-08 – 2020-10-11 (×3): 1000 ug via INTRAMUSCULAR
  Filled 2020-10-08 (×4): qty 1

## 2020-10-08 MED ORDER — ESCITALOPRAM OXALATE 10 MG PO TABS
5.0000 mg | ORAL_TABLET | Freq: Every day | ORAL | Status: DC
  Administered 2020-10-08 – 2020-10-11 (×3): 5 mg via ORAL
  Filled 2020-10-08 (×4): qty 0.5

## 2020-10-08 MED ORDER — PEG 3350-KCL-NA BICARB-NACL 420 G PO SOLR
4000.0000 mL | Freq: Once | ORAL | Status: AC
  Administered 2020-10-08: 4000 mL via ORAL
  Filled 2020-10-08: qty 4000

## 2020-10-08 NOTE — Consult Note (Signed)
Consultation  Referring Provider: Dr. Dayna Barker   Admit date: 5/7 Consult date: 5/8         Reason for Consultation: IDA             HPI:   Claudia Friedman is a 87 y.o. lady with history of hypertension and CKD here with dizziness and subsequently found to have IDA. Patient states she has been getting more dizzy and weak which is what prompted her to come to the ED. She denies any overt GI bleeding. She does endorse decrease in hunger. She has never had an EGD or colonoscopy. There is no family history of GI malignancies. Was taking NSAIDS daily for a while to help her sleep but has not taken any for a month. She has no abdominal pain. She lives by herself and the only family she has is her son who lives in [place]. She does not smoke or drink.  Past Medical History:  Diagnosis Date  . Anxiety   . History of echocardiogram    a. 12/19/15: EF 60-65%, no RWMA, nl LV diastolic fxn, mild AI, LA nl, PASP nl  . History of nuclear stress test    a. 12/19/15: no evidence of ischemia, EF 76%, low risk study  . Hypertension     Past Surgical History:  Procedure Laterality Date  . CHOLECYSTECTOMY      Family History  Problem Relation Age of Onset  . CAD Other   . Osteoporosis Other     Social History   Tobacco Use  . Smoking status: Never Smoker  . Smokeless tobacco: Never Used  Substance Use Topics  . Alcohol use: Yes    Comment: occasional   . Drug use: No    Prior to Admission medications   Medication Sig Start Date End Date Taking? Authorizing Provider  acetaminophen (TYLENOL) 500 MG tablet Take 500 mg by mouth every 6 (six) hours as needed.   Yes [provider]  escitalopram (LEXAPRO) 5 MG tablet Take 5 mg by mouth daily. 07/20/20  Yes [provider]  hydrochlorothiazide (HYDRODIURIL) 12.5 MG tablet Take 12.5 mg by mouth daily. 09/06/20  Yes [provider]  lisinopril (ZESTRIL) 2.5 MG tablet Take 2.5 mg by mouth daily. 09/14/20  Yes [provider]  naproxen sodium (ALEVE) 220 MG tablet Take 220-440 mg by mouth every 6 (six) hours as needed. For pain and sleep.   Yes [provider]    Current Facility-Administered Medications  Medication Dose Route Frequency Provider Last Rate Last Admin  . 0.9 %  sodium chloride infusion   Intravenous Continuous Mansy, Jan A, MD 100 mL/hr at 10/08/20 0537 Infusion Verify at 10/08/20 0537  . acetaminophen (TYLENOL) tablet 650 mg  650 mg Oral Q6H PRN Mansy, Jan A, MD       Or  . acetaminophen (TYLENOL) suppository 650 mg  650 mg Rectal Q6H PRN Mansy, Jan A, MD      . ALPRAZolam Prudy Feeler) tablet 0.25 mg  0.25 mg Oral BID PRN Mansy, Jan A, MD   0.25 mg at 10/08/20 0045  . cyanocobalamin ((VITAMIN B-12)) injection 1,000 mcg  1,000 mcg Intramuscular Q0600 Kc, Ramesh, MD      . escitalopram (LEXAPRO) tablet 5 mg  5 mg Oral Daily Kc, Ramesh, MD      . ondansetron (ZOFRAN) tablet 4 mg  4 mg Oral Q6H PRN Mansy, Jan A, MD       Or  . ondansetron (ZOFRAN) injection 4 mg  4 mg Intravenous Q6H PRN Mansy, Jan A, MD      . pantoprazole (PROTONIX) 80 mg in sodium chloride 0.9 % 100 mL (0.8 mg/mL) infusion  8 mg/hr Intravenous Continuous Mansy, Jan A, MD 10 mL/hr at 10/08/20 0710 8 mg/hr at 10/08/20 0710  . [START ON 10/11/2020] pantoprazole (PROTONIX) injection 40 mg  40 mg Intravenous Q12H Mansy, Jan A, MD      . traZODone (DESYREL) tablet 25 mg  25 mg Oral QHS PRN Mansy, Vernetta Honey, MD        Allergies as of 10/07/2020  . (No Known Allergies)     Review of Systems:    All systems reviewed and negative except where noted in HPI.  Review of Systems  Constitutional: Positive for malaise/fatigue. Negative for chills, fever and weight loss.  Respiratory: Negative for shortness of breath.   Cardiovascular: Negative for chest pain.  Gastrointestinal: Negative for abdominal pain, blood in stool, constipation, diarrhea, heartburn, melena, nausea and vomiting.  Musculoskeletal: Negative for joint pain.  Skin:  Negative for itching and rash.  Neurological: Positive for dizziness. Negative for focal weakness.  Psychiatric/Behavioral: Negative for substance abuse.  All other systems reviewed and are negative.     Physical Exam:  Vital signs in last 24 hours: Temp:  [97.9 F (36.6 C)-98.8 F (37.1 C)] 97.9 F (36.6 C) (05/08 1136) Pulse Rate:  [64-89] 64 (05/08 1136) Resp:  [16-27] 18 (05/08 1136) BP: (93-144)/(44-63) 131/60 (05/08 1136) SpO2:  [94 %-100 %] 94 % (05/08 1136) Weight:  [78.5 kg] 78.5 kg (05/07 1515)   General:   Pleasant in NAD Head:  Normocephalic and atraumatic. Eyes:   No icterus.   Conjunctiva pink. Mouth: Mucosa pink moist, no lesions. Neck:  Supple; no masses felt Lungs:  No respiratory distress Abdomen:   Flat, soft, nondistended, nontender. Msk:  No clubbing or cyanosis. Strength 5/5. Symmetrical without gross deformities. Neurologic:  Alert and  oriented x4;  Cranial nerves II-XII intact.  Skin:  Warm, dry, pink without significant lesions or rashes. Psych:  Alert and cooperative. Normal affect.  LAB RESULTS: Recent Labs    10/07/20 1523 10/07/20 2120 10/08/20 0255 10/08/20 0854  WBC 10.8*  --  9.2  --   HGB 6.5* 6.5* 8.5* 8.6*  HCT 22.3* 22.0* 27.4* 27.5*  PLT 435*  --  347  --    BMET Recent Labs    10/07/20 1523 10/08/20 0255  NA 135 138  K 4.2 3.6  CL 104 107  CO2 21* 22  GLUCOSE 123* 88  BUN 39* 37*  CREATININE 1.77* 1.61*  CALCIUM 9.0 8.5*   LFT No results for input(s): PROT, ALBUMIN, AST, ALT, ALKPHOS, BILITOT, BILIDIR, IBILI in the last 72 hours. PT/INR Recent Labs    10/07/20 1910  LABPROT 13.7  INR 1.1    STUDIES: CT Head Wo Contrast  Result Date: 10/07/2020 CLINICAL DATA:  Increasing dizziness EXAM: CT HEAD WITHOUT CONTRAST TECHNIQUE: Contiguous axial images were obtained from the base of the skull through the vertex without intravenous contrast. COMPARISON:  None. FINDINGS: Brain: No evidence of acute infarction,  hemorrhage, hydrocephalus, extra-axial collection or mass lesion/mass effect. Chronic atrophic and ischemic changes are noted. Vascular: No hyperdense vessel or unexpected calcification. Skull: Normal. Negative for fracture or focal lesion. Sinuses/Orbits: No acute finding. Other: None. IMPRESSION: Chronic atrophic and ischemic changes without acute abnormality. Electronically Signed   By: Alcide Clever M.D.   On: 10/07/2020 19:54  Impression / Plan:   87 y/o lady with history of hypertension here with IDA without overt GI bleeding  - maintain active type and screen and transfuse if hemoglobin is less than 7 - no overt GI bleeding so can do PPI daily - can check CBC daily unless there is overt GI bleeding - will need EGD/Colonoscopy, will plan for tomorrow - clear liquid diet today and NPO at midnight except meds and prep - no heparin products - further recs after procedures tomorrow - drink 1/2 prep tonight with other 1/2 tomorrow morning with goal of being finished by 10 am.  Please call with any questions or concerns  Merlyn Lot MD, MPH [facility]

## 2020-10-08 NOTE — Progress Notes (Signed)
PROGRESS NOTE    Claudia Friedman  XLK:[id number] DOB: 17-Nov-1930 DOA: 10/07/2020 PCP: [facility]   Chief Complaint  Patient presents with  . Dizziness   Brief Narrative: 87 year old female with history of hypertension, anxiety presents to the ED with generalized weakness lightheadedness dizziness, symptom onset over the last month.\ As per the report she was currently not able to walk without holding to furniture.  She denied any dyspnea on exertion or chest pain or palpitations.  No nausea or vomiting or heartburn or abdominal pain.  No melena or bright red bleeding per rectum.  She stated that she has problems with depth perception sometimes.  No dysuria, oliguria or hematuria or flank pain.  ED Course: Upon presentation to the ER vital signs were within normal.  Blood pressure later on was 93/53 and improved.  Labs revealed a hemoglobin of 6.5 and hematocrit 22.3 with low RBC indices and platelets of 435.  Further work-up showed low serum ferritin of 5 and iron saturation ratio with elevated TIBC of 483 and low serum iron of 14 with normal folate of 17.3.  Stool occult blood came back positive.   EKG as reviewed by me :  showed normal sinus rhythm with a rate of 79 Imaging: Noncontrasted head CT scan revealed chronic atrophic and ischemic changes without acute abnormality. Patient was transfused blood and admitted.  Subjective: Seen this morning.  Resting comfortably.Denies nausea vomiting abdominal pain.   Assessment & Plan:  Severe symptomatic anemia FOBT+-suspected GI bleeding Severe iron deficiency anemia S/p prbc transfusion . Hb improved. GI is consulted- notified Dr Mia Creek. Cont to monitor serial h/h. Cont PPI ,avoid nsaid/asa.  Patient reports taking Aleve a month ago on regular basis and has been stopped by primary care doctor and has been taking Tylenol.  Anemia panel shows low ferritin and also low B12.  Normal folate acid. Recent Labs  Lab 10/07/20 1523  10/07/20 2120 10/08/20 0255 10/08/20 0854  HGB 6.5* 6.5* 8.5* 8.6*  HCT 22.3* 22.0* 27.4* 27.5*   B12 deficiency-start replacement 1000 mcg IM daily x1 wk, tehn weekly x4 wk then monthly ( if possible) or may do PO high dose daily.  AKI CKD stage IIIb Baseline creatinine was 1.3 in February 2022: creat slightly better, cont ivf, monitor uop, bmp. Cont to hold hctz/acei. Recent Labs  Lab 10/07/20 1523 10/08/20 0255  BUN 39* 37*  CREATININE 1.77* 1.61*   Essential HTN: home home hctz/acei, monitor bp.  Anxiety Disorder-cont h her lexapro  Overweight w/ BMI 27.  GOC : DNR Diet Order            Diet NPO time specified  Diet effective midnight                Patient's Body mass index is 27.92 kg/m.  DVT prophylaxis: SCDs Start: 10/07/20 2049 Code Status:   Code Status: DNR  Family Communication: plan of care discussed with patient at bedside.  Status is: Inpatient Remains inpatient appropriate because:Ongoing diagnostic testing needed not appropriate for outpatient work up and Inpatient level of care appropriate due to severity of illness  Dispo: The patient is from: Home lives alone has a son in [place] visited 5/7              Anticipated d/c is to: Home              Patient currently is not medically stable to d/c.   Difficult to place patient No Unresulted Labs (From admission, onward)  Start     Ordered   10/09/20 0500  Basic metabolic panel  Daily,   R     Question:  Specimen collection method  Answer:  Lab=Lab collect   10/08/20 1037   10/09/20 0500  CBC  Daily,   R     Question:  Specimen collection method  Answer:  Lab=Lab collect   10/08/20 1037   10/07/20 2101  Hemoglobin and hematocrit, blood  Now then every 6 hours,   STAT      10/07/20 2100   10/07/20 1516  Urinalysis, Complete w Microscopic  ONCE - STAT,   STAT        10/07/20 1516        Medications reviewed:  Scheduled Meds: . cyanocobalamin  1,000 mcg Intramuscular Q0600  .  escitalopram  5 mg Oral Daily  . [START ON 10/11/2020] pantoprazole  40 mg Intravenous Q12H   Continuous Infusions: . sodium chloride 100 mL/hr at 10/08/20 0537  . pantoprozole (PROTONIX) infusion 8 mg/hr (10/08/20 0710)    Consultants: Gastroenterology  Procedures:see note  Antimicrobials: Anti-infectives (From admission, onward)   None     Culture/Microbiology No results found for: SDES, SPECREQUEST, CULT, REPTSTATUS  Other culture-see note  Objective: Vitals: Today's Vitals   10/08/20 0141 10/08/20 0452 10/08/20 0845 10/08/20 0854  BP: (!) 122/55 (!) 144/55 (!) 132/56   Pulse: 70 70 65   Resp: 18 18 18    Temp: 98.3 F (36.8 C) 97.9 F (36.6 C) 98.1 F (36.7 C)   TempSrc: Oral Oral    SpO2: 98% 96% 96%   Weight:      Height:      PainSc:    0-No pain    Intake/Output Summary (Last 24 hours) at 10/08/2020 1041 Last data filed at 10/08/2020 0800 Gross per 24 hour  Intake 1299.34 ml  Output 800 ml  Net 499.34 ml   Filed Weights   10/07/20 1515  Weight: 78.5 kg   Weight change:   Intake/Output from previous day: 05/07 0701 - 05/08 0700 In: 1299.3 [P.O.:120; I.V.:394.3; Blood:785] Out: 600 [Urine:600] Intake/Output this shift: Total I/O In: -  Out: 200 [Urine:200] Filed Weights   10/07/20 1515  Weight: 78.5 kg    Examination: General exam: AAO x3 , older than stated age, weak appearing. HEENT:Oral mucosa moist, Ear/Nose WNL grossly,dentition normal. Respiratory system: bilaterally diminished, no crackles no use of accessory muscle, non tender. Cardiovascular system: S1 & S2 +,rrr No JVD. Gastrointestinal system: Abdomen soft, NT,ND, BS+. Nervous System:Alert, awake, moving extremities Extremities: no edema, distal peripheral pulses palpable.  Skin: No rashes,no icterus. MSK: Normal muscle bulk,tone, power  Data Reviewed: I have personally reviewed following labs and imaging studies CBC: Recent Labs  Lab 10/07/20 1523 10/07/20 2120  10/08/20 0255 10/08/20 0854  WBC 10.8*  --  9.2  --   HGB 6.5* 6.5* 8.5* 8.6*  HCT 22.3* 22.0* 27.4* 27.5*  MCV 77.2*  --  78.7*  --   PLT 435*  --  347  --    Basic Metabolic Panel: Recent Labs  Lab 10/07/20 1523 10/08/20 0255  NA 135 138  K 4.2 3.6  CL 104 107  CO2 21* 22  GLUCOSE 123* 88  BUN 39* 37*  CREATININE 1.77* 1.61*  CALCIUM 9.0 8.5*   GFR: Estimated Creatinine Clearance: 25.5 mL/min (A) (by C-G formula based on SCr of 1.61 mg/dL (H)). Liver Function Tests: No results for input(s): AST, ALT, ALKPHOS, BILITOT, PROT, ALBUMIN in the last  168 hours. No results for input(s): LIPASE, AMYLASE in the last 168 hours. No results for input(s): AMMONIA in the last 168 hours. Coagulation Profile: Recent Labs  Lab 10/07/20 1910  INR 1.1   Cardiac Enzymes: No results for input(s): CKTOTAL, CKMB, CKMBINDEX, TROPONINI in the last 168 hours. BNP (last 3 results) No results for input(s): PROBNP in the last 8760 hours. HbA1C: No results for input(s): HGBA1C in the last 72 hours. CBG: No results for input(s): GLUCAP in the last 168 hours. Lipid Profile: No results for input(s): CHOL, HDL, LDLCALC, TRIG, CHOLHDL, LDLDIRECT in the last 72 hours. Thyroid Function Tests: No results for input(s): TSH, T4TOTAL, FREET4, T3FREE, THYROIDAB in the last 72 hours. Anemia Panel: Recent Labs    10/07/20 1523 10/07/20 2120  VITAMINB12  --  114*  FOLATE 17.3  --   FERRITIN 5*  --   TIBC 483*  --   IRON 14*  --   RETICCTPCT  --  1.9   Sepsis Labs: No results for input(s): PROCALCITON, LATICACIDVEN in the last 168 hours.  Recent Results (from the past 240 hour(s))  SARS CORONAVIRUS 2 (TAT 6-24 HRS) Nasopharyngeal Nasopharyngeal Swab     Status: None   Collection Time: 10/07/20 11:02 PM   Specimen: Nasopharyngeal Swab  Result Value Ref Range Status   SARS Coronavirus 2 NEGATIVE NEGATIVE Final    Comment: (NOTE) SARS-CoV-2 target nucleic acids are NOT DETECTED.  The  SARS-CoV-2 RNA is generally detectable in upper and lower respiratory specimens during the acute phase of infection. Negative results do not preclude SARS-CoV-2 infection, do not rule out co-infections with other pathogens, and should not be used as the sole basis for treatment or other patient management decisions. Negative results must be combined with clinical observations, patient history, and epidemiological information. The expected result is Negative.  Fact Sheet for Patients: HairSlick.no  Fact Sheet for Healthcare Providers: quierodirigir.com  This test is not yet approved or cleared by the [place] FDA and  has been authorized for detection and/or diagnosis of SARS-CoV-2 by FDA under an Emergency Use Authorization (EUA). This EUA will remain  in effect (meaning this test can be used) for the duration of the COVID-19 declaration under Se ction 564(b)(1) of the Act, 21 U.S.C. section 360bbb-3(b)(1), unless the authorization is terminated or revoked sooner.  Performed at [facility], [address] [place], [place] [address]      Radiology Studies: CT Head Wo Contrast  Result Date: 10/07/2020 CLINICAL DATA:  Increasing dizziness EXAM: CT HEAD WITHOUT CONTRAST TECHNIQUE: Contiguous axial images were obtained from the base of the skull through the vertex without intravenous contrast. COMPARISON:  None. FINDINGS: Brain: No evidence of acute infarction, hemorrhage, hydrocephalus, extra-axial collection or mass lesion/mass effect. Chronic atrophic and ischemic changes are noted. Vascular: No hyperdense vessel or unexpected calcification. Skull: Normal. Negative for fracture or focal lesion. Sinuses/Orbits: No acute finding. Other: None. IMPRESSION: Chronic atrophic and ischemic changes without acute abnormality. Electronically Signed   By: Alcide Clever M.D.   On: 10/07/2020 19:54     LOS: 1 day   Lanae Boast,  MD [facility]  10/08/2020, 10:41 AM

## 2020-10-08 NOTE — Plan of Care (Signed)
  Problem: Clinical Measurements: Goal: Ability to maintain clinical measurements within normal limits will improve Outcome: Progressing   Problem: Nutrition: Goal: Adequate nutrition will be maintained Outcome: Progressing   Problem: Coping: Goal: Level of anxiety will decrease Outcome: Progressing   Problem: Pain Managment: Goal: General experience of comfort will improve Outcome: Progressing   Problem: Safety: Goal: Ability to remain free from injury will improve Outcome: Progressing   

## 2020-10-09 ENCOUNTER — Inpatient Hospital Stay: Payer: Medicare Other | Admitting: Anesthesiology

## 2020-10-09 ENCOUNTER — Encounter: Payer: Self-pay | Admitting: Family Medicine

## 2020-10-09 ENCOUNTER — Encounter
Admission: EM | Disposition: A | Discharge status: Home Health | Payer: Self-pay | Source: Home / Self Care | Attending: Internal Medicine

## 2020-10-09 HISTORY — PX: COLONOSCOPY: SHX5424

## 2020-10-09 HISTORY — PX: ESOPHAGOGASTRODUODENOSCOPY: SHX5428

## 2020-10-09 LAB — BASIC METABOLIC PANEL
Anion gap: 8 (ref 5–15)
BUN: 21 mg/dL (ref 8–23)
CO2: 20 mmol/L — ABNORMAL LOW (ref 22–32)
Calcium: 8.5 mg/dL — ABNORMAL LOW (ref 8.9–10.3)
Chloride: 109 mmol/L (ref 98–111)
Creatinine, Ser: 1.08 mg/dL — ABNORMAL HIGH (ref 0.44–1.00)
GFR, Estimated: 49 mL/min — ABNORMAL LOW (ref >60)
Glucose, Bld: 89 mg/dL (ref 70–99)
Potassium: 3.7 mmol/L (ref 3.5–5.1)
Sodium: 137 mmol/L (ref 135–145)

## 2020-10-09 LAB — CBC
HCT: 31.4 % — ABNORMAL LOW (ref 36.0–46.0)
Hemoglobin: 9.2 g/dL — ABNORMAL LOW (ref 12.0–15.0)
MCH: 24.7 pg — ABNORMAL LOW (ref 26.0–34.0)
MCHC: 29.3 g/dL — ABNORMAL LOW (ref 30.0–36.0)
MCV: 84.2 fL (ref 80.0–100.0)
Platelets: 213 10*3/uL (ref 150–400)
RBC: 3.73 MIL/uL — ABNORMAL LOW (ref 3.87–5.11)
RDW: 18.1 % — ABNORMAL HIGH (ref 11.5–15.5)
WBC: 8.6 10*3/uL (ref 4.0–10.5)
nRBC: 0 % (ref 0.0–0.2)

## 2020-10-09 SURGERY — EGD (ESOPHAGOGASTRODUODENOSCOPY)
Anesthesia: General

## 2020-10-09 MED ORDER — PROPOFOL 500 MG/50ML IV EMUL
INTRAVENOUS | Status: DC | PRN
  Administered 2020-10-09: 50 ug/kg/min via INTRAVENOUS

## 2020-10-09 MED ORDER — SODIUM CHLORIDE 0.9 % IV SOLN
INTRAVENOUS | Status: DC
  Administered 2020-10-09: 1000 mL via INTRAVENOUS

## 2020-10-09 MED ORDER — DEXMEDETOMIDINE HCL 200 MCG/2ML IV SOLN
INTRAVENOUS | Status: DC | PRN
  Administered 2020-10-09 (×5): 4 ug via INTRAVENOUS

## 2020-10-09 MED ORDER — PROPOFOL 10 MG/ML IV BOLUS
INTRAVENOUS | Status: DC | PRN
  Administered 2020-10-09: 20 mg via INTRAVENOUS
  Administered 2020-10-09: 10 mg via INTRAVENOUS
  Administered 2020-10-09: 20 mg via INTRAVENOUS

## 2020-10-09 MED ORDER — LIDOCAINE HCL (PF) 2 % IJ SOLN
INTRAMUSCULAR | Status: AC
  Filled 2020-10-09: qty 4

## 2020-10-09 MED ORDER — EPHEDRINE SULFATE 50 MG/ML IJ SOLN
INTRAMUSCULAR | Status: DC | PRN
  Administered 2020-10-09: 10 mg via INTRAVENOUS
  Administered 2020-10-09: 15 mg via INTRAVENOUS

## 2020-10-09 MED ORDER — PROPOFOL 500 MG/50ML IV EMUL
INTRAVENOUS | Status: AC
  Filled 2020-10-09: qty 50

## 2020-10-09 MED ORDER — SODIUM CHLORIDE 0.9 % IV SOLN: INTRAVENOUS | Status: AC

## 2020-10-09 NOTE — Op Note (Signed)
[facility] [address] [facility] Patient Name: Claudia Friedman Procedure Date: 10/09/2020 3:35 PM MRN: [id number] Account #: [id number] Date of Birth: 1930-08-29 Admit Type: Inpatient Age: 87 Room: [facility] ENDO ROOM 3 Gender: Female Note Status: Finalized Procedure:             Colonoscopy Indications:           Iron deficiency anemia Providers:             Andrey Farmer MD, MD Referring MD:          No Local Md, MD (Referring MD) Medicines:             Monitored Anesthesia Care Complications:         No immediate complications. Procedure:             Pre-Anesthesia Assessment:                        - Prior to the procedure, a History and Physical was                         performed, and patient medications and allergies were                         reviewed. The patient is competent. The risks and                         benefits of the procedure and the sedation options and                         risks were discussed with the patient. All questions                         were answered and informed consent was obtained.                         Patient identification and proposed procedure were                         verified by the physician, the nurse, the anesthetist                         and the technician in the endoscopy suite. Mental                         Status Examination: alert and oriented. Airway                         Examination: normal oropharyngeal airway and neck                         mobility. Respiratory Examination: clear to                         auscultation. CV Examination: normal. Prophylactic                         Antibiotics: The patient does not require prophylactic  antibiotics. Prior Anticoagulants: The patient has                         taken no previous anticoagulant or antiplatelet                         agents. ASA Grade Assessment: II - A patient with mild                         systemic disease.  After reviewing the risks and                         benefits, the patient was deemed in satisfactory                         condition to undergo the procedure. The anesthesia                         plan was to use monitored anesthesia care (MAC).                         Immediately prior to administration of medications,                         the patient was re-assessed for adequacy to receive                         sedatives. The heart rate, respiratory rate, oxygen                         saturations, blood pressure, adequacy of pulmonary                         ventilation, and response to care were monitored                         throughout the procedure. The physical status of the                         patient was re-assessed after the procedure.                        After obtaining informed consent, the colonoscope was                         passed under direct vision. Throughout the procedure,                         the patient's blood pressure, pulse, and oxygen                         saturations were monitored continuously. The                         Colonoscope was introduced through the anus and                         advanced to the the terminal ileum. The colonoscopy  was performed without difficulty. The patient                         tolerated the procedure well. The quality of the bowel                         preparation was adequate to identify polyps. Findings:      The perianal and digital rectal examinations were normal.      The terminal ileum appeared normal.      Multiple small-mouthed diverticula were found in the sigmoid colon.      The exam was otherwise without abnormality on direct and retroflexion       views. Impression:            - The examined portion of the ileum was normal.                        - Diverticulosis in the sigmoid colon.                        - The examination was otherwise normal on direct and                          retroflexion views.                        - No specimens collected. Recommendation:        - Return patient to hospital ward for ongoing care.                        - Advance diet as tolerated.                        - Refer to a gastroenterologist. Procedure Code(s):     --- Professional ---                        [phone], Colonoscopy, flexible; diagnostic, including                         collection of specimen(s) by brushing or washing, when                         performed (separate procedure) Diagnosis Code(s):     --- Professional ---                        D50.9, Iron deficiency anemia, unspecified                        K57.30, Diverticulosis of large intestine without                         perforation or abscess without bleeding CPT copyright 2019 American Medical Association. All rights reserved. The codes documented in this report are preliminary and upon coder review may  be revised to meet current compliance requirements. Andrey Farmer MD, MD 10/09/2020 4:15:48 PM Number of Addenda: 0 Note Initiated On: 10/09/2020 3:35 PM Scope Withdrawal Time: 0 hours 4 minutes 48 seconds  Total Procedure Duration: 0 hours 12 minutes 40 seconds  Estimated Blood Loss:  Estimated blood loss: none.      [facility]

## 2020-10-09 NOTE — Progress Notes (Signed)
Initial Nutrition Assessment  DOCUMENTATION CODES:  Not applicable  INTERVENTION:   Advance diet per MD after endoscopy procedures  Add nutrition supplements (depending on diet order) to augment poor intake.   NUTRITION DIAGNOSIS:  Inadequate oral intake related to decreased appetite,other (see comment) (weakness) as evidenced by per patient/family report.  GOAL:  Patient will meet greater than or equal to 90% of their needs  MONITOR:  PO intake,Supplement acceptance,Diet advancement  REASON FOR ASSESSMENT:  Malnutrition Screening Tool    ASSESSMENT:  Pt presented to ED with increase in weakness and dizziness. Poor PO intake due to pt being unable to prepare food. Lives at home alone. In ED, pt found to be hypotensive and with a low Hgb. GI consulted for suspected bleed. PMH relevant for HTN and anxiety.  Pt out of room at the time of assessment for EGD/colonoscopy to evaluate source of bleeding. Noted that weakness has been ongoing for the past month. Pt refused clear liquid meals yesterday prior to NPO status. Will monitor for diet advancement after procedures and add nutrition supplements. Pt reported decreased intake at home due to her weakness. Limited recent weight hx available to determine if weight has been lost in the last month  Average Meal Intake: Marland Kitchen 5/7-5/9: 0% intake x 3 recorded meals  Relevant Scheduled Meds: . cyanocobalamin  1,000 mcg Intramuscular Q0600  . pantoprazole  40 mg Intravenous Q12H   Relevant Continuous Infusions: . sodium chloride 50 mL/hr at 10/09/20 1338  . sodium chloride 1,000 mL (10/09/20 1422)  . pantoprozole (PROTONIX) infusion 8 mg/hr (10/09/20 1338)   Relevant PRN Meds: ondansetron  Labs reviewed  NUTRITION - FOCUSED PHYSICAL EXAM: Defer to in-person assessment  Diet Order:   Diet Order            Diet NPO time specified  Diet effective now                EDUCATION NEEDS:  No education needs have been identified at this  time  Skin:  Skin Assessment: Reviewed RN Assessment  Last BM:  5/9 per RN documentation, type 7 (bowel prep)  Height:  Ht Readings from Last 1 Encounters:  10/07/20 5\' 6"  (1.676 m)   Weight:  Wt Readings from Last 1 Encounters:  10/07/20 78.5 kg    Ideal Body Weight:  59.1 kg  BMI:  Body mass index is 27.92 kg/m.  Estimated Nutritional Needs:   Kcal:  1500-1700 kcal/d  Protein:  75-85 g/d  Fluid:  >1536mL/d   80m, RD, LDN Clinical Dietitian Pager on Amion

## 2020-10-09 NOTE — Care Plan (Signed)
EGD/Colon performed and is unremarkable. Can d/c protonix. Will need PO iron and can follow-up with [place] as an outpatient for further monitoring and consideration of VCE. Please call with any questions or concerns.  Merlyn Lot MD, MPH [facility] GI

## 2020-10-09 NOTE — Anesthesia Preprocedure Evaluation (Addendum)
Anesthesia Evaluation  Patient identified by MRN, date of birth, ID band Patient awake    Reviewed: Allergy & Precautions, NPO status , Patient's Chart, lab work & pertinent test results  Airway Mallampati: II  TM Distance: >3 FB Neck ROM: Full    Dental no notable dental hx. (+) Teeth Intact   Pulmonary neg pulmonary ROS,    Pulmonary exam normal        Cardiovascular hypertension, Pt. on medications negative cardio ROS Normal cardiovascular exam     Neuro/Psych Anxiety negative neurological ROS  negative psych ROS   GI/Hepatic negative GI ROS, Neg liver ROS,   Endo/Other  negative endocrine ROS  Renal/GU negative Renal ROS  negative genitourinary   Musculoskeletal negative musculoskeletal ROS (+)   Abdominal   Peds negative pediatric ROS (+)  Hematology negative hematology ROS (+) anemia ,   Anesthesia Other Findings Essential hypertension (Primary Dx);  Stage 3a chronic kidney disease (CMS-HCC);  Class 1 obesity due to excess calories with serious comorbidity and body mass index (BMI) of 32.0 to 32.9 in adult;  GAD (generalized anxiety disorder  Reproductive/Obstetrics negative OB ROS                            Anesthesia Physical Anesthesia Plan  ASA: II  Anesthesia Plan: General   Post-op Pain Management:    Induction: Intravenous  PONV Risk Score and Plan: 2 and Propofol infusion and TIVA  Airway Management Planned: Natural Airway and Nasal Cannula  Additional Equipment:   Intra-op Plan:   Post-operative Plan:   Informed Consent: I have reviewed the patients History and Physical, chart, labs and discussed the procedure including the risks, benefits and alternatives for the proposed anesthesia with the patient or authorized representative who has indicated his/her understanding and acceptance.       Plan Discussed with: CRNA, Anesthesiologist and  Surgeon  Anesthesia Plan Comments:         Anesthesia Quick Evaluation

## 2020-10-09 NOTE — Progress Notes (Signed)
PROGRESS NOTE    Claudia Friedman  NWG:[id number] DOB: 1930/08/15 DOA: 10/07/2020 PCP: [facility]   Chief Complaint  Patient presents with  . Dizziness   Brief Narrative: 87 year old female with history of hypertension, anxiety presents to the ED with generalized weakness lightheadedness dizziness, symptom onset over the last month.\ As per the report she was currently not able to walk without holding to furniture.  She denied any dyspnea on exertion or chest pain or palpitations.  No nausea or vomiting or heartburn or abdominal pain.  No melena or bright red bleeding per rectum.  She stated that she has problems with depth perception sometimes.  No dysuria, oliguria or hematuria or flank pain.  ED Course: Upon presentation to the ER vital signs were within normal.  Blood pressure later on was 93/53 and improved.  Labs revealed a hemoglobin of 6.5 and hematocrit 22.3 with low RBC indices and platelets of 435.  Further work-up showed low serum ferritin of 5 and iron saturation ratio with elevated TIBC of 483 and low serum iron of 14 with normal folate of 17.3.  Stool occult blood came back positive.   EKG as reviewed by me :  showed normal sinus rhythm with a rate of 79 Imaging: Noncontrasted head CT scan revealed chronic atrophic and ischemic changes without acute abnormality. Patient was transfused blood and admitted.  Subjective:  H&H is stabilized overnight since transfusion No fever. Creatinine has improved to 1.0 Cipro HC having constant loose stool from prep and feeling cold-provided her with blanket at bedside commode. Denies nausea vomiting abdominal pain. Assessment & Plan:  Severe symptomatic anemia FOBT+-suspected GI bleeding Severe iron deficiency anemia S/p 2 units prbc transfusionS-hemoglobin appropriately increased and stabilized.  Seen by GI -defer further plan to GI- noted plan for EGD/colonoscopy. Monitor CBC daily.Cont PPI ,avoid nsaid/asa/Heparins. Patient  reports taking Aleve a month ago on regular basis and has been stopped by primary care doctor. Anemia panel shows low ferritin and also low B12.  Normal folate acid. Recent Labs  Lab 10/07/20 2120 10/08/20 0255 10/08/20 0854 10/08/20 1451 10/09/20 0520  HGB 6.5* 8.5* 8.6* 8.8* 9.2*  HCT 22.0* 27.4* 27.5* 28.4* 31.4*   B12 deficiency-started her on replacement 1000 mcg IM daily x1 wk, then weekly x4 wk then monthly ( if possible) or may do PO high dose daily.  AKI CKD stage IIIa  previous creatinine was 1.3 in February 2022: creat improved to 1.0 likely to baseline.  Continue to hold hold hctz/acei as long as normotensive otherwise will resume. Recent Labs  Lab 10/07/20 1523 10/08/20 0255 10/09/20 0520  BUN 39* 37* 21  CREATININE 1.77* 1.61* 1.08*   Essential HTN: Blood pressure remains controlled.  Cont to hold home hctz/acei, monitor bp.  Anxiety Disorder-cont her lexapro  Overweight w/ BMI 27: We will obtain PCP follow-up  GOC : DNR Diet Order            Diet NPO time specified  Diet effective midnight                Patient's Body mass index is 27.92 kg/m.  DVT prophylaxis: SCDs Start: 10/07/20 2049 Code Status:   Code Status: DNR  Family Communication: plan of care discussed with patient at bedside.  I called to update son-no answer, left msg to call back.   Status is: Inpatient Remains inpatient appropriate because:Ongoing diagnostic testing needed not appropriate for outpatient work up and Inpatient level of care appropriate due to severity of illness  Dispo: The patient is from: Home lives alone has a son in [place] visited 5/7              Anticipated d/c is to: Home              Patient currently is not medically stable to d/c.   Difficult to place patient No Unresulted Labs (From admission, onward)          Start     Ordered   10/09/20 0500  Basic metabolic panel  Daily,   R     Question:  Specimen collection method  Answer:  Lab=Lab collect    10/08/20 1037   10/09/20 0500  CBC  Daily,   R     Question:  Specimen collection method  Answer:  Lab=Lab collect   10/08/20 1037   10/07/20 1516  Urinalysis, Complete w Microscopic  ONCE - STAT,   STAT        10/07/20 1516        Medications reviewed:  Scheduled Meds: . cyanocobalamin  1,000 mcg Intramuscular Q0600  . escitalopram  5 mg Oral Daily  . [START ON 10/11/2020] pantoprazole  40 mg Intravenous Q12H   Continuous Infusions: . sodium chloride 100 mL/hr at 10/08/20 2344  . pantoprozole (PROTONIX) infusion 8 mg/hr (10/09/20 0345)    Consultants: Gastroenterology  Procedures:see note  Antimicrobials: Anti-infectives (From admission, onward)   None     Culture/Microbiology No results found for: SDES, SPECREQUEST, CULT, REPTSTATUS  Other culture-see note  Objective: Vitals: Today's Vitals   10/08/20 0854 10/08/20 1136 10/08/20 2018 10/09/20 0616  BP:  131/60 135/60 128/60  Pulse:  64 62 60  Resp:  18    Temp:  97.9 F (36.6 C) 98 F (36.7 C) 98 F (36.7 C)  TempSrc:      SpO2:  94% 94% 94%  Weight:      Height:      PainSc: 0-No pain  0-No pain     Intake/Output Summary (Last 24 hours) at 10/09/2020 0821 Last data filed at 10/08/2020 2344 Gross per 24 hour  Intake 1944.76 ml  Output 203 ml  Net 1741.76 ml   Filed Weights   10/07/20 1515  Weight: 78.5 kg   Weight change:   Intake/Output from previous day: 05/08 0701 - 05/09 0700 In: 1944.8 [I.V.:1944.8] Out: 403 [Urine:400; Stool:3] Intake/Output this shift: No intake/output data recorded. Filed Weights   10/07/20 1515  Weight: 78.5 kg    Examination: general exam: AAOx 3, older than stated age, weak appearing. HEENT:Oral mucosa moist, Ear/Nose WNL grossly, dentition normal. Respiratory system: bilaterally diminished, no crackles, no use of accessory muscle Cardiovascular system: S1 & S2 +, No JVD,. Gastrointestinal system: Abdomen soft,NT,ND, BS+ Nervous System:Alert, awake, moving  extremities and grossly nonfocal Extremities: no edema, distal peripheral pulses palpable.  Skin: No rashes,no icterus. MSK: Normal muscle bulk,tone, power  Data Reviewed: I have personally reviewed following labs and imaging studies CBC: Recent Labs  Lab 10/07/20 1523 10/07/20 2120 10/08/20 0255 10/08/20 0854 10/08/20 1451 10/09/20 0520  WBC 10.8*  --  9.2  --   --  8.6  HGB 6.5* 6.5* 8.5* 8.6* 8.8* 9.2*  HCT 22.3* 22.0* 27.4* 27.5* 28.4* 31.4*  MCV 77.2*  --  78.7*  --   --  84.2  PLT 435*  --  347  --   --  213   Basic Metabolic Panel: Recent Labs  Lab 10/07/20 1523 10/08/20 0255 10/09/20 0520  NA  135 138 137  K 4.2 3.6 3.7  CL 104 107 109  CO2 21* 22 20*  GLUCOSE 123* 88 89  BUN 39* 37* 21  CREATININE 1.77* 1.61* 1.08*  CALCIUM 9.0 8.5* 8.5*   GFR: Estimated Creatinine Clearance: 38.1 mL/min (A) (by C-G formula based on SCr of 1.08 mg/dL (H)). Liver Function Tests: No results for input(s): AST, ALT, ALKPHOS, BILITOT, PROT, ALBUMIN in the last 168 hours. No results for input(s): LIPASE, AMYLASE in the last 168 hours. No results for input(s): AMMONIA in the last 168 hours. Coagulation Profile: Recent Labs  Lab 10/07/20 1910  INR 1.1   Cardiac Enzymes: No results for input(s): CKTOTAL, CKMB, CKMBINDEX, TROPONINI in the last 168 hours. BNP (last 3 results) No results for input(s): PROBNP in the last 8760 hours. HbA1C: No results for input(s): HGBA1C in the last 72 hours. CBG: No results for input(s): GLUCAP in the last 168 hours. Lipid Profile: No results for input(s): CHOL, HDL, LDLCALC, TRIG, CHOLHDL, LDLDIRECT in the last 72 hours. Thyroid Function Tests: No results for input(s): TSH, T4TOTAL, FREET4, T3FREE, THYROIDAB in the last 72 hours. Anemia Panel: Recent Labs    10/07/20 1523 10/07/20 2120  VITAMINB12  --  114*  FOLATE 17.3  --   FERRITIN 5*  --   TIBC 483*  --   IRON 14*  --   RETICCTPCT  --  1.9   Sepsis Labs: No results for  input(s): PROCALCITON, LATICACIDVEN in the last 168 hours.  Recent Results (from the past 240 hour(s))  SARS CORONAVIRUS 2 (TAT 6-24 HRS) Nasopharyngeal Nasopharyngeal Swab     Status: None   Collection Time: 10/07/20 11:02 PM   Specimen: Nasopharyngeal Swab  Result Value Ref Range Status   SARS Coronavirus 2 NEGATIVE NEGATIVE Final    Comment: (NOTE) SARS-CoV-2 target nucleic acids are NOT DETECTED.  The SARS-CoV-2 RNA is generally detectable in upper and lower respiratory specimens during the acute phase of infection. Negative results do not preclude SARS-CoV-2 infection, do not rule out co-infections with other pathogens, and should not be used as the sole basis for treatment or other patient management decisions. Negative results must be combined with clinical observations, patient history, and epidemiological information. The expected result is Negative.  Fact Sheet for Patients: HairSlick.nohttps://www.fda.gov/media/138098/download  Fact Sheet for Healthcare Providers: quierodirigir.comhttps://www.fda.gov/media/138095/download  This test is not yet approved or cleared by the [place] FDA and  has been authorized for detection and/or diagnosis of SARS-CoV-2 by FDA under an Emergency Use Authorization (EUA). This EUA will remain  in effect (meaning this test can be used) for the duration of the COVID-19 declaration under Se ction 564(b)(1) of the Act, 21 U.S.C. section 360bbb-3(b)(1), unless the authorization is terminated or revoked sooner.  Performed at [facility], [address] [place], [place] [address]      Radiology Studies: CT Head Wo Contrast  Result Date: 10/07/2020 CLINICAL DATA:  Increasing dizziness EXAM: CT HEAD WITHOUT CONTRAST TECHNIQUE: Contiguous axial images were obtained from the base of the skull through the vertex without intravenous contrast. COMPARISON:  None. FINDINGS: Brain: No evidence of acute infarction, hemorrhage, hydrocephalus, extra-axial collection or  mass lesion/mass effect. Chronic atrophic and ischemic changes are noted. Vascular: No hyperdense vessel or unexpected calcification. Skull: Normal. Negative for fracture or focal lesion. Sinuses/Orbits: No acute finding. Other: None. IMPRESSION: Chronic atrophic and ischemic changes without acute abnormality. Electronically Signed   By: Alcide CleverMark  Lukens M.D.   On: 10/07/2020 19:54  LOS: 2 days   Lanae Boast, MD [facility]  10/09/2020, 8:21 AM

## 2020-10-09 NOTE — Transfer of Care (Signed)
Immediate Anesthesia Transfer of Care Note  Patient: Jaclynne Sallis  Procedure(s) Performed: ESOPHAGOGASTRODUODENOSCOPY (EGD) (N/A ) COLONOSCOPY (N/A )  Patient Location: PACU  Anesthesia Type:General  Level of Consciousness: sedated  Airway & Oxygen Therapy: Patient Spontanous Breathing and Patient connected to nasal cannula oxygen  Post-op Assessment: Report given to RN and Post -op Vital signs reviewed and stable  Post vital signs: Reviewed and stable  Last Vitals:  Vitals Value Taken Time  BP 116/60 10/09/20 1624  Temp 36.8 C 10/09/20 1614  Pulse 81 10/09/20 1628  Resp 24 10/09/20 1628  SpO2 97 % 10/09/20 1628  Vitals shown include unvalidated device data.  Last Pain:  Vitals:   10/09/20 1624  TempSrc:   PainSc: 0-No pain      Patients Stated Pain Goal: 0 (10/09/20 1403)  Complications: No complications documented.

## 2020-10-09 NOTE — Plan of Care (Signed)
  Problem: Clinical Measurements: Goal: Diagnostic test results will improve Outcome: Progressing   Problem: Activity: Goal: Risk for activity intolerance will decrease Outcome: Progressing   Problem: Elimination: Goal: Will not experience complications related to bowel motility Outcome: Progressing   Problem: Pain Managment: Goal: General experience of comfort will improve Outcome: Progressing   Problem: Safety: Goal: Ability to remain free from injury will improve Outcome: Progressing

## 2020-10-09 NOTE — Op Note (Signed)
[facility] Gastroenterology Patient Name: Claudia Friedman Procedure Date: 10/09/2020 3:37 PM MRN: [id number] Account #: [id number] Date of Birth: [birth year] Admit Type: Inpatient Age: 87 Room: [facility] ENDO ROOM 3 Gender: Female Note Status: Finalized Procedure:             Upper GI endoscopy Indications:           Iron deficiency anemia Providers:             Andrey Farmer MD, MD Referring MD:          No Local Md, MD (Referring MD) Medicines:             Monitored Anesthesia Care Complications:         No immediate complications. Procedure:             Pre-Anesthesia Assessment:                        - Prior to the procedure, a History and Physical was                         performed, and patient medications and allergies were                         reviewed. The patient is competent. The risks and                         benefits of the procedure and the sedation options and                         risks were discussed with the patient. All questions                         were answered and informed consent was obtained.                         Patient identification and proposed procedure were                         verified by the physician, the nurse, the anesthetist                         and the technician in the endoscopy suite. Mental                         Status Examination: alert and oriented. Airway                         Examination: normal oropharyngeal airway and neck                         mobility. Respiratory Examination: clear to                         auscultation. CV Examination: normal. Prophylactic                         Antibiotics: The patient does not require prophylactic  antibiotics. Prior Anticoagulants: The patient has                         taken no previous anticoagulant or antiplatelet                         agents. ASA Grade Assessment: II - A patient with mild                         systemic  disease. After reviewing the risks and                         benefits, the patient was deemed in satisfactory                         condition to undergo the procedure. The anesthesia                         plan was to use monitored anesthesia care (MAC).                         Immediately prior to administration of medications,                         the patient was re-assessed for adequacy to receive                         sedatives. The heart rate, respiratory rate, oxygen                         saturations, blood pressure, adequacy of pulmonary                         ventilation, and response to care were monitored                         throughout the procedure. The physical status of the                         patient was re-assessed after the procedure.                        After obtaining informed consent, the endoscope was                         passed under direct vision. Throughout the procedure,                         the patient's blood pressure, pulse, and oxygen                         saturations were monitored continuously. The Endoscope                         was introduced through the mouth, and advanced to the                         second part of duodenum. The upper GI endoscopy was  somewhat difficult due to the patient's discomfort                         during the procedure. The patient tolerated the                         procedure well. Findings:      A non-obstructing Schatzki ring was found at the lower esophageal       sphincter.      The entire examined stomach was normal.      The examined duodenum was normal. Impression:            - Non-obstructing Schatzki ring.                        - Normal stomach.                        - Normal examined duodenum.                        - No specimens collected. Recommendation:        - Perform a colonoscopy today. Procedure Code(s):     --- Professional ---                         [phone], Esophagogastroduodenoscopy, flexible,                         transoral; diagnostic, including collection of                         specimen(s) by brushing or washing, when performed                         (separate procedure) Diagnosis Code(s):     --- Professional ---                        K22.2, Esophageal obstruction                        D50.9, Iron deficiency anemia, unspecified CPT copyright 2019 American Medical Association. All rights reserved. The codes documented in this report are preliminary and upon coder review may  be revised to meet current compliance requirements. Andrey Farmer MD, MD 10/09/2020 4:12:47 PM Number of Addenda: 0 Note Initiated On: 10/09/2020 3:37 PM Estimated Blood Loss:  Estimated blood loss: none.      [facility]

## 2020-10-10 ENCOUNTER — Encounter: Payer: Self-pay | Admitting: Gastroenterology

## 2020-10-10 DIAGNOSIS — D519 Vitamin B12 deficiency anemia, unspecified: Secondary | ICD-10-CM

## 2020-10-10 LAB — CBC
HCT: 26.3 % — ABNORMAL LOW (ref 36.0–46.0)
Hemoglobin: 7.9 g/dL — ABNORMAL LOW (ref 12.0–15.0)
MCH: 24.5 pg — ABNORMAL LOW (ref 26.0–34.0)
MCHC: 30 g/dL (ref 30.0–36.0)
MCV: 81.7 fL (ref 80.0–100.0)
Platelets: 318 10*3/uL (ref 150–400)
RBC: 3.22 MIL/uL — ABNORMAL LOW (ref 3.87–5.11)
RDW: 17.9 % — ABNORMAL HIGH (ref 11.5–15.5)
WBC: 7.9 10*3/uL (ref 4.0–10.5)
nRBC: 0 % (ref 0.0–0.2)

## 2020-10-10 LAB — BASIC METABOLIC PANEL
Anion gap: 7 (ref 5–15)
BUN: 16 mg/dL (ref 8–23)
CO2: 20 mmol/L — ABNORMAL LOW (ref 22–32)
Calcium: 8.2 mg/dL — ABNORMAL LOW (ref 8.9–10.3)
Chloride: 111 mmol/L (ref 98–111)
Creatinine, Ser: 1.05 mg/dL — ABNORMAL HIGH (ref 0.44–1.00)
GFR, Estimated: 51 mL/min — ABNORMAL LOW (ref >60)
Glucose, Bld: 88 mg/dL (ref 70–99)
Potassium: 3.5 mmol/L (ref 3.5–5.1)
Sodium: 138 mmol/L (ref 135–145)

## 2020-10-10 LAB — HEMOGLOBIN AND HEMATOCRIT, BLOOD
HCT: 29.1 % — ABNORMAL LOW (ref 36.0–46.0)
Hemoglobin: 8.8 g/dL — ABNORMAL LOW (ref 12.0–15.0)

## 2020-10-10 MED ORDER — PANTOPRAZOLE SODIUM 40 MG PO TBEC
40.0000 mg | DELAYED_RELEASE_TABLET | Freq: Every day | ORAL | Status: DC
  Administered 2020-10-10 – 2020-10-11 (×2): 40 mg via ORAL
  Filled 2020-10-10 (×2): qty 1

## 2020-10-10 NOTE — Care Management Important Message (Signed)
Important Message  Patient Details  Name: Claudia Friedman MRN: [id number] Date of Birth: [birth year]   Medicare Important Message Given:  Yes     Johnell Comings 10/10/2020, 10:54 AM

## 2020-10-10 NOTE — TOC Initial Note (Signed)
Transition of Care [facility]) - Initial/Assessment Note    Patient Details  Name: Claudia Friedman MRN: [id number] Date of Birth: 11/14/1930  [facility] CM/SW Contact:    Gildardo Griffes, LCSW Phone Number: 10/10/2020, 3:55 PM  Clinical Narrative:                  CSW spoke with patient regarding recommendations for PT and OT home health services. Patient is in agreement with receiving home health services for PT and OT with no preference of agency. Patient discussed recommended DME of 3in1 is agreeable to receive this. Reports already having walker and 2 canes at home. Patient reports her friends often take her food and her friends would be picking her up upon discharge tomorrow.   CSW spoke with [facility] Cower with [facility] who can accept patient for home health PT and OT.   CSW spoke with Bjorn Loser with [facility] who can deliver [facility] to patient's room.   No other needs identified at this time.   Expected Discharge Plan: Home w Home Health Services Barriers to Discharge: Continued Medical Work up   Patient Goals and CMS Choice   CMS Medicare.gov Compare Post Acute Care list provided to:: Patient Choice offered to / list presented to : Patient  Expected Discharge Plan and Services Expected Discharge Plan: Home w Home Health Services       Living arrangements for the past 2 months: Single Family Home                 DME Arranged: 3-N-1 DME Agency: [facility] Date DME Agency Contacted: 10/10/20 Time DME Agency Contacted: [phone] Representative spoke with at DME Agency: Bjorn Loser HH Arranged: PT,OT HH Agency: [facility] Date HH Agency Contacted: 10/10/20 Time HH Agency Contacted: 1554 Representative spoke with at [facility] Agency: Barbara Cower  Prior Living Arrangements/Services Living arrangements for the past 2 months: Single Family Home Lives with:: Self   Do you feel safe going back to the place where you live?: Yes               Activities of Daily Living Home  Assistive Devices/Equipment: Cane (specify quad or straight),Walker (specify type) ADL Screening (condition at time of admission) Patient's cognitive ability adequate to safely complete daily activities?: Yes Is the patient deaf or have difficulty hearing?: No Does the patient have difficulty seeing, even when wearing glasses/contacts?: No Does the patient have difficulty concentrating, remembering, or making decisions?: No Patient able to express need for assistance with ADLs?: Yes Does the patient have difficulty dressing or bathing?: No Independently performs ADLs?: Yes (appropriate for developmental age) Does the patient have difficulty walking or climbing stairs?: Yes Weakness of Legs: Both Weakness of Arms/Hands: None  Permission Sought/Granted                  Emotional Assessment   Attitude/Demeanor/Rapport: Gracious Affect (typically observed): Calm Orientation: : Oriented to Self,Oriented to Place,Oriented to  Time,Oriented to Situation Alcohol / Substance Use: Not Applicable Psych Involvement: No (comment)  Admission diagnosis:  Acute blood loss anemia [D62] Guaiac positive stools [R19.5] Symptomatic anemia [D64.9] Patient Active Problem List   Diagnosis Date Noted  . Acute blood loss anemia 10/07/2020  . Chest pain with moderate risk for cardiac etiology 12/19/2015  . Uncontrolled hypertension 12/19/2015  . Anxiety 12/19/2015   PCP:  [facility] Pharmacy:   [facility] [phone] [address] [address], [place] - [address] [place] [address] Phone: [phone]  Fax: [phone]     Social Determinants of Health (SDOH) Interventions    Readmission Risk Interventions No flowsheet data found.

## 2020-10-10 NOTE — Evaluation (Signed)
Occupational Therapy Evaluation Patient Details Name: Mariachristina Holle MRN: [id number] DOB: Dec 28, 1930 Today's Date: 10/10/2020    History of Present Illness 87 y.o. Caucasian female with medical history significant for essential hypertension and anxiety, who presents to the emergency room with acute onset of generalized weakness and lightheadedness and dizziness which have been worsening over the last month.  She stated that she is currently not able to walk without holding to furniture.  She denies any dyspnea on exertion or chest pain or palpitations.  No nausea or vomiting or heartburn or abdominal pain.  No melena or bright red bleeding per rectum.  She stated that she has problems with depth perception sometimes.   Clinical Impression   Patient presenting with decreased I in self care, balance, functional mobility/transfer, endurance, and safety awareness.  Patient reports living at home alone PTA. She endorses using SPC, RW, and at times furniture walking for ambulation. Pt reports feeling unwell for ~ 1 month and "just laying around on the couch because I felt so bad". Pt reports friends were assisting with food and "whatever else I needed" and she has this assistance when returning home.  Patient currently functioning at initially CGA progressing to close supervision for the rest of session without use of AD. Pt standing at sink for grooming tasks, ambulation 200' with supervision, and performing LB dressing with supervision. Pt reports, " This is the best I have felt in a month". Pt is agreeable to [facility] services to address functional deficits.  Patient will benefit from acute OT to increase overall independence in the areas of ADLs, functional mobility, and safety awareness in order to safely discharge home.    Follow Up Recommendations  Home health OT;Supervision - Intermittent    Equipment Recommendations  3 in 1 bedside commode       Precautions / Restrictions Precautions Precautions: Fall       Mobility Bed Mobility Overal bed mobility: Needs Assistance Bed Mobility: Supine to Sit;Sit to Supine     Supine to sit: Supervision Sit to supine: Supervision   General bed mobility comments: cuing for technique with no physical assistance provided    Transfers Overall transfer level: Needs assistance Equipment used: None Transfers: Sit to/from [employer] Transfers Sit to Stand: Supervision Stand pivot transfers: Supervision       General transfer comment: initial few steps from bed with CGA progressing to close supervisoin during the rest of session. Pt reports this is the first time she has been out of bed and moved around other than transfer to [facility]    Balance Overall balance assessment: Needs assistance Sitting-balance support: Feet supported Sitting balance-Leahy Scale: Good     Standing balance support: During functional activity Standing balance-Leahy Scale: Fair                             ADL either performed or assessed with clinical judgement   ADL Overall ADL's : Needs assistance/impaired Eating/Feeding: Independent   Grooming: Wash/dry hands;Wash/dry face;Oral care;Standing;Supervision/safety               Lower Body Dressing: Supervision/safety;Sitting/lateral leans Lower Body Dressing Details (indicate cue type and reason): to don B non slip slippers Toilet Transfer: Supervision/safety           Functional mobility during ADLs: Supervision/safety       Vision Patient Visual Report: No change from baseline  Pertinent Vitals/Pain Pain Assessment: No/denies pain     Hand Dominance Right   Extremity/Trunk Assessment Upper Extremity Assessment Upper Extremity Assessment: Overall WFL for tasks assessed   Lower Extremity Assessment Lower Extremity Assessment: Overall WFL for tasks assessed       Communication Communication Communication: No difficulties   Cognition Arousal/Alertness:  Awake/alert Behavior During Therapy: WFL for tasks assessed/performed Overall Cognitive Status: Within Functional Limits for tasks assessed                                 General Comments: Pt pleasant and cooperative   General Comments               Home Living Family/patient expects to be discharged to:: Private residence Living Arrangements: Alone Available Help at Discharge: Friend(s);Available PRN/intermittently Type of Home: House Home Access: Stairs to enter [employer] of Steps: 4-5   Home Layout: One level     Bathroom Shower/Tub: Producer, television/film/video: Standard     Home Equipment: Environmental consultant - 2 wheels;Cane - single point          Prior Functioning/Environment Level of Independence: Independent with assistive device(s)        Comments: Pt reports using RW and SPC in home and in community [facility] more so). Pt does also report furniture walking within the home.        OT Problem List: Decreased strength;Impaired balance (sitting and/or standing);Decreased safety awareness;Decreased activity tolerance      OT Treatment/Interventions: Self-care/ADL training;Manual therapy;Therapeutic exercise;Modalities;Patient/family education;Balance training;Energy conservation;Therapeutic activities;DME and/or AE instruction    OT Goals(Current goals can be found in the care plan section) Acute Rehab OT Goals Patient Stated Goal: to get better and go home OT Goal Formulation: With patient Time For Goal Achievement: 10/24/20 Potential to Achieve Goals: Good ADL Goals Pt Will Perform Grooming: with modified independence;standing Pt Will Perform Lower Body Dressing: with modified independence;sit to/from stand Pt Will Transfer to Toilet: with modified independence;ambulating Pt Will Perform Toileting - Clothing Manipulation and hygiene: with modified independence;sit to/from stand  OT Frequency: Min 2X/week   Barriers to D/C:    none  known at this time          AM-PAC OT "6 Clicks" Daily Activity     Outcome Measure Help from another person eating meals?: None Help from another person taking care of personal grooming?: None Help from another person toileting, which includes using toliet, bedpan, or urinal?: A Little Help from another person bathing (including washing, rinsing, drying)?: A Little Help from another person to put on and taking off regular upper body clothing?: None Help from another person to put on and taking off regular lower body clothing?: A Little 6 Click Score: 21   End of Session Nurse Communication: Mobility status  Activity Tolerance: Patient tolerated treatment well Patient left: in chair;with call bell/phone within reach;with chair alarm set  OT Visit Diagnosis: Unsteadiness on feet (R26.81);Muscle weakness (generalized) (M62.81)                Time: 6712-4580 OT Time Calculation (min): 24 min Charges:  OT General Charges $OT Visit: 1 Visit OT Evaluation $OT Eval Low Complexity: 1 Low OT Treatments $Self Care/Home Management : 8-22 mins  Jackquline Denmark, MS, OTR/L , CBIS ascom [phone]  10/10/20, 11:14 AM

## 2020-10-10 NOTE — Progress Notes (Signed)
PROGRESS NOTE    Claudia Friedman  PYP:[id number] DOB: 1930-08-24 DOA: 10/07/2020 PCP: [facility]   Chief Complaint  Patient presents with  . Dizziness   Brief Narrative: 87 year old female with history of hypertension, anxiety presents to the ED with generalized weakness lightheadedness dizziness, symptom onset over the last month.\ As per the report she was currently not able to walk without holding to furniture.  She denied any dyspnea on exertion or chest pain or palpitations.  No nausea or vomiting or heartburn or abdominal pain.  No melena or bright red bleeding per rectum.  She stated that she has problems with depth perception sometimes.  No dysuria, oliguria or hematuria or flank pain.  ED Course: Upon presentation to the ER vital signs were within normal.  Blood pressure later on was 93/53 and improved.  Labs revealed a hemoglobin of 6.5 and hematocrit 22.3 with low RBC indices and platelets of 435.  Further work-up showed low serum ferritin of 5 and iron saturation ratio with elevated TIBC of 483 and low serum iron of 14 with normal folate of 17.3.  Stool occult blood came back positive.   EKG as reviewed by me :  showed normal sinus rhythm with a rate of 79 Imaging: Noncontrasted head CT scan revealed chronic atrophic and ischemic changes without acute abnormality. Patient was transfused blood and admitted. Seen by GI underwent EGD colonoscopy 5/9 no acute finding  Subjective:  Seen this morning.  Complains of generalized weakness work with OT awaiting to work with PT  She lives alone and would like to stay 1 more day  Assessment & Plan:  Severe symptomatic anemia FOBT+-suspected GI bleeding Severe iron deficiency anemia S/p 2 units prbc transfusions-hemoglobin improved this morning decreased to 7.9 g on recheck improved 8.8 g. Seen by GI -status post EGD/colonoscopy-no acute finding, continue PPI, check CBC in a.m.Patient reports taking Aleve a month ago on regular  basis and has been stopped by primary care doctor. Anemia panel shows low ferritin and also low B12.Normal folate acid. Recent Labs  Lab 10/08/20 0854 10/08/20 1451 10/09/20 0520 10/10/20 0228 10/10/20 1119  HGB 8.6* 8.8* 9.2* 7.9* 8.8*  HCT 27.5* 28.4* 31.4* 26.3* 29.1*   B12 deficiency-started her on replacement 1000 mcg IM daily x1 wk, then weekly x4 wk then monthly ( if possible).  She will need to have weekly inj replacement done by her PCP.  AKI CKD stage IIIa  previous creatinine was 1.3 in February 2022: creat improved to 1.0 likely to baseline.  Continue to hold hold hctz/acei. Recent Labs  Lab 10/07/20 1523 10/08/20 0255 10/09/20 0520 10/10/20 0228  BUN 39* 37* 21 16  CREATININE 1.77* 1.61* 1.08* 1.05*   Essential HTN: Blood pressure remains controlled without resuming home meds.  We will have her follow-up with PCP before resuming her home hctz/acei, monitor bp.  Anxiety Disorder-cont her lexapro  Overweight w/ BMI 27: We will obtain PCP follow-up  GOC : DNR Diet Order            Diet regular Room service appropriate? Yes; Fluid consistency: Thin  Diet effective now                Patient's Body mass index is 27.92 kg/m.  DVT prophylaxis: SCDs Start: 10/07/20 2049 Code Status:   Code Status: DNR  Family Communication: plan of care discussed with patient at bedside. I called to update son-no answer, left msg to call back.   Status is: Inpatient Remains inpatient  appropriate because:Ongoing diagnostic testing needed not appropriate for outpatient work up and Inpatient level of care appropriate due to severity of illness  Dispo: The patient is from: Home lives alone has a son in [place] visited 5/7              Anticipated d/c is to: Home              Patient currently is not medically stable to d/c.  Anticipate discharge tomorrow if CBC stable Angiulli weakness improves.   Difficult to place patient No Unresulted Labs (From admission, onward)           Start     Ordered   10/09/20 0500  Basic metabolic panel  Daily,   R     Question:  Specimen collection method  Answer:  Lab=Lab collect   10/08/20 1037   10/09/20 0500  CBC  Daily,   R     Question:  Specimen collection method  Answer:  Lab=Lab collect   10/08/20 1037   10/07/20 1516  Urinalysis, Complete w Microscopic  ONCE - STAT,   STAT        10/07/20 1516        Medications reviewed:  Scheduled Meds: . cyanocobalamin  1,000 mcg Intramuscular Q0600  . escitalopram  5 mg Oral Daily  . pantoprazole  40 mg Oral Daily   Continuous Infusions:   Consultants: Gastroenterology  Procedures: EGD/colonoscopy   Antimicrobials: Anti-infectives (From admission, onward)   None     Culture/Microbiology No results found for: SDES, SPECREQUEST, CULT, REPTSTATUS  Other culture-see note  Objective: Vitals: Today's Vitals   10/09/20 2200 10/10/20 0430 10/10/20 0851 10/10/20 1124  BP:  (!) 120/48 (!) 126/52 (!) 132/59  Pulse:  65 69 75  Resp:  16 18 18   Temp:  98.2 F (36.8 C) 98.1 F (36.7 C) 97.9 F (36.6 C)  TempSrc:   Oral Oral  SpO2:  94% 95% 97%  Weight:      Height:      PainSc: 0-No pain  0-No pain     Intake/Output Summary (Last 24 hours) at 10/10/2020 1238 Last data filed at 10/10/2020 0900 Gross per 24 hour  Intake 950.45 ml  Output 0 ml  Net 950.45 ml   Filed Weights   10/07/20 1515  Weight: 78.5 kg   Weight change:   Intake/Output from previous day: 05/09 0701 - 05/10 0700 In: 710.5 [I.V.:710.5] Out: 0  Intake/Output this shift: Total I/O In: 240 [P.O.:240] Out: -  Filed Weights   10/07/20 1515  Weight: 78.5 kg    Examination: General exam: AAOx 3, weak, elderly lady.  On room air.   HEENT:Oral mucosa moist, Ear/Nose WNL grossly, dentition normal. Respiratory system: bilaterally diminished, no crackles, no use of accessory muscle Cardiovascular system: S1 & S2 +, No JVD,. Gastrointestinal system: Abdomen soft, NT,ND, BS+ Nervous  System:Alert, awake, moving extremities and grossly nonfocal Extremities: no edema, distal peripheral pulses palpable.  Skin: No rashes,no icterus. MSK: Normal muscle bulk,tone, power  Data Reviewed: I have personally reviewed following labs and imaging studies CBC: Recent Labs  Lab 10/07/20 1523 10/07/20 2120 10/08/20 0255 10/08/20 0854 10/08/20 1451 10/09/20 0520 10/10/20 0228 10/10/20 1119  WBC 10.8*  --  9.2  --   --  8.6 7.9  --   HGB 6.5*   < > 8.5* 8.6* 8.8* 9.2* 7.9* 8.8*  HCT 22.3*   < > 27.4* 27.5* 28.4* 31.4* 26.3* 29.1*  MCV 77.2*  --  78.7*  --   --  84.2 81.7  --   PLT 435*  --  347  --   --  213 318  --    < > = values in this interval not displayed.   Basic Metabolic Panel: Recent Labs  Lab 10/07/20 1523 10/08/20 0255 10/09/20 0520 10/10/20 0228  NA 135 138 137 138  K 4.2 3.6 3.7 3.5  CL 104 107 109 111  CO2 21* 22 20* 20*  GLUCOSE 123* 88 89 88  BUN 39* 37* 21 16  CREATININE 1.77* 1.61* 1.08* 1.05*  CALCIUM 9.0 8.5* 8.5* 8.2*   GFR: Estimated Creatinine Clearance: 39.2 mL/min (A) (by C-G formula based on SCr of 1.05 mg/dL (H)). Liver Function Tests: No results for input(s): AST, ALT, ALKPHOS, BILITOT, PROT, ALBUMIN in the last 168 hours. No results for input(s): LIPASE, AMYLASE in the last 168 hours. No results for input(s): AMMONIA in the last 168 hours. Coagulation Profile: Recent Labs  Lab 10/07/20 1910  INR 1.1   Cardiac Enzymes: No results for input(s): CKTOTAL, CKMB, CKMBINDEX, TROPONINI in the last 168 hours. BNP (last 3 results) No results for input(s): PROBNP in the last 8760 hours. HbA1C: No results for input(s): HGBA1C in the last 72 hours. CBG: No results for input(s): GLUCAP in the last 168 hours. Lipid Profile: No results for input(s): CHOL, HDL, LDLCALC, TRIG, CHOLHDL, LDLDIRECT in the last 72 hours. Thyroid Function Tests: No results for input(s): TSH, T4TOTAL, FREET4, T3FREE, THYROIDAB in the last 72 hours. Anemia  Panel: Recent Labs    10/07/20 1523 10/07/20 2120  VITAMINB12  --  114*  FOLATE 17.3  --   FERRITIN 5*  --   TIBC 483*  --   IRON 14*  --   RETICCTPCT  --  1.9   Sepsis Labs: No results for input(s): PROCALCITON, LATICACIDVEN in the last 168 hours.  Recent Results (from the past 240 hour(s))  SARS CORONAVIRUS 2 (TAT 6-24 HRS) Nasopharyngeal Nasopharyngeal Swab     Status: None   Collection Time: 10/07/20 11:02 PM   Specimen: Nasopharyngeal Swab  Result Value Ref Range Status   SARS Coronavirus 2 NEGATIVE NEGATIVE Final    Comment: (NOTE) SARS-CoV-2 target nucleic acids are NOT DETECTED.  The SARS-CoV-2 RNA is generally detectable in upper and lower respiratory specimens during the acute phase of infection. Negative results do not preclude SARS-CoV-2 infection, do not rule out co-infections with other pathogens, and should not be used as the sole basis for treatment or other patient management decisions. Negative results must be combined with clinical observations, patient history, and epidemiological information. The expected result is Negative.  Fact Sheet for Patients: HairSlick.no  Fact Sheet for Healthcare Providers: quierodirigir.com  This test is not yet approved or cleared by the [place] FDA and  has been authorized for detection and/or diagnosis of SARS-CoV-2 by FDA under an Emergency Use Authorization (EUA). This EUA will remain  in effect (meaning this test can be used) for the duration of the COVID-19 declaration under Se ction 564(b)(1) of the Act, 21 U.S.C. section 360bbb-3(b)(1), unless the authorization is terminated or revoked sooner.  Performed at [facility], [address] [place], [place] [address]      Radiology Studies: No results found.   LOS: 3 days   Lanae Boast, MD [facility]  10/10/2020, 12:38 PM

## 2020-10-10 NOTE — Anesthesia Postprocedure Evaluation (Signed)
Anesthesia Post Note  Patient: Claudia Friedman  Procedure(s) Performed: ESOPHAGOGASTRODUODENOSCOPY (EGD) (N/A ) COLONOSCOPY (N/A )  Patient location during evaluation: PACU Anesthesia Type: General Level of consciousness: awake and alert Pain management: pain level controlled Vital Signs Assessment: post-procedure vital signs reviewed and stable Respiratory status: spontaneous breathing, nonlabored ventilation and respiratory function stable Cardiovascular status: blood pressure returned to baseline and stable Postop Assessment: no apparent nausea or vomiting and spinal receding Anesthetic complications: no   No complications documented.   Last Vitals:  Vitals:   10/09/20 2038 10/10/20 0430  BP: 137/63 (!) 120/48  Pulse: 77 65  Resp: 16 16  Temp: 36.7 C 36.8 C  SpO2: 99% 94%    Last Pain:  Vitals:   10/09/20 2200  TempSrc:   PainSc: 0-No pain                 Aurelio Brash [place] Arseneault

## 2020-10-10 NOTE — Evaluation (Signed)
Physical Therapy Evaluation Patient Details Name: Claudia Friedman MRN: [id number] DOB: June 20, 1930 Today's Date: 10/10/2020   History of Present Illness  Claudia Friedman is an 89yoF who [facility] after having dizzy spells at home. Pt also reports weakness. PMH: HTN, GAD. Hb in 6s, pt hypotensive in ED. Pt admitted with ABLA and GIB.  Clinical Impression  Pt admitted with above diagnosis. Pt currently with functional limitations due to the deficits listed below (see "PT Problem List"). Upon entry, pt in bed, awake and agreeable to participate. The pt is alert, pleasant, interactive, and able to provide info regarding prior level of function, both in tolerance and independence. ModI bed mobility, supervision transfer to standing due to mild unsteadiness, minGuard to supervision AMB in hallway, minGuard for 3 stairs, due to obvious weakness and difficulty with eccentric control. Patient's performance this date reveals decreased ability, independence, and tolerance in performing all basic mobility required for performance of activities of daily living. Pt requires additional DME, close physical assistance, and cues for safe participate in mobility. Pt will benefit from skilled PT intervention to increase independence and safety with basic mobility in preparation for discharge to the venue listed below.       Follow Up Recommendations Home health PT;Supervision - Intermittent    Equipment Recommendations  None recommended by PT    Recommendations for Other Services       Precautions / Restrictions Precautions Precautions: Fall Restrictions Weight Bearing Restrictions: No      Mobility  Bed Mobility Overal bed mobility: Modified Independent Bed Mobility: Supine to Sit;Sit to Supine     Supine to sit: Modified independent (Device/Increase time) Sit to supine: Modified independent (Device/Increase time)        Transfers Overall transfer level: Needs assistance Equipment used:  None Transfers: Sit to/from Stand Sit to Stand: Supervision         General transfer comment: mild LOB  Ambulation/Gait Ambulation/Gait assistance: Min guard Gait Distance (Feet): 350 Feet Assistive device: None  0.74m/s         Stairs Stairs: Yes (3) Stairs assistance: Min guard Stair Management: One rail Right Number of Stairs: 3 General stair comments: poor eccentric control  Wheelchair Mobility    Modified Rankin (Stroke Patients Only)       Balance                                             Pertinent Vitals/Pain Pain Assessment: No/denies pain    Home Living Family/patient expects to be discharged to:: Private residence Living Arrangements: Alone Available Help at Discharge: Friend(s);Available PRN/intermittently Type of Home: House Home Access: Stairs to enter Entrance Stairs-Rails: None Entrance Stairs-Number of Steps: 4-5 Home Layout: One level Home Equipment: Walker - 2 wheels;Cane - single point      Prior Function Level of Independence: Needs assistance   Gait / Transfers Assistance Needed: at baseline does not a device reports one fall in the report part.  ADL's / Homemaking Assistance Needed: Independent; stil drives, groceryshops.  Comments: .     Hand Dominance   Dominant Hand: Right    Extremity/Trunk Assessment   Upper Extremity Assessment Upper Extremity Assessment: Generalized weakness;Overall [facility] for tasks assessed    Lower Extremity Assessment Lower Extremity Assessment: Generalized weakness;Overall [facility] for tasks assessed       Communication      Cognition  Arousal/Alertness: Awake/alert Behavior During Therapy: WFL for tasks assessed/performed Overall Cognitive Status: Within Functional Limits for tasks assessed                                        General Comments      Exercises     Assessment/Plan    PT Assessment Patient needs continued PT services  PT Problem List  Decreased strength;Decreased activity tolerance;Decreased balance;Decreased mobility       PT Treatment Interventions Gait training;Balance training;Stair training;Functional mobility training;Therapeutic activities;Therapeutic exercise;Patient/family education    PT Goals (Current goals can be found in the Care Plan section)  Acute Rehab PT Goals Patient Stated Goal: to get better and go home PT Goal Formulation: With patient Time For Goal Achievement: 10/24/20 Potential to Achieve Goals: Good    Frequency Min 2X/week   Barriers to discharge        Co-evaluation               AM-PAC PT "6 Clicks" Mobility  Outcome Measure Help needed turning from your back to your side while in a flat bed without using bedrails?: A Little Help needed moving from lying on your back to sitting on the side of a flat bed without using bedrails?: A Little Help needed moving to and from a bed to a chair (including a wheelchair)?: A Little Help needed standing up from a chair using your arms (e.g., wheelchair or bedside chair)?: A Little Help needed to walk in hospital room?: A Little Help needed climbing 3-5 steps with a railing? : A Little 6 Click Score: 18    End of Session   Activity Tolerance: Patient tolerated treatment well;No increased pain Patient left: in bed;with call bell/phone within reach;with bed alarm set   PT Visit Diagnosis: Difficulty in walking, not elsewhere classified (R26.2);Other abnormalities of gait and mobility (R26.89);Unsteadiness on feet (R26.81)    Time: 5053-9767 PT Time Calculation (min) (ACUTE ONLY): 20 min   Charges:   PT Evaluation $PT Eval Moderate Complexity: 1 Mod PT Treatments $Gait Training: 8-22 mins        3:28 PM, 10/10/20 Rosamaria Lints, PT, DPT Physical Therapist - [facility]  [phone] (ASCOM)   Layman Gully C 10/10/2020, 3:25 PM

## 2020-10-11 DIAGNOSIS — D649 Anemia, unspecified: Secondary | ICD-10-CM

## 2020-10-11 LAB — CBC
HCT: 26.5 % — ABNORMAL LOW (ref 36.0–46.0)
Hemoglobin: 8.1 g/dL — ABNORMAL LOW (ref 12.0–15.0)
MCH: 24.8 pg — ABNORMAL LOW (ref 26.0–34.0)
MCHC: 30.6 g/dL (ref 30.0–36.0)
MCV: 81 fL (ref 80.0–100.0)
Platelets: 285 10*3/uL (ref 150–400)
RBC: 3.27 MIL/uL — ABNORMAL LOW (ref 3.87–5.11)
RDW: 18.4 % — ABNORMAL HIGH (ref 11.5–15.5)
WBC: 7.8 10*3/uL (ref 4.0–10.5)
nRBC: 0 % (ref 0.0–0.2)

## 2020-10-11 LAB — BASIC METABOLIC PANEL
Anion gap: 8 (ref 5–15)
BUN: 19 mg/dL (ref 8–23)
CO2: 20 mmol/L — ABNORMAL LOW (ref 22–32)
Calcium: 8.3 mg/dL — ABNORMAL LOW (ref 8.9–10.3)
Chloride: 110 mmol/L (ref 98–111)
Creatinine, Ser: 1.14 mg/dL — ABNORMAL HIGH (ref 0.44–1.00)
GFR, Estimated: 46 mL/min — ABNORMAL LOW (ref >60)
Glucose, Bld: 90 mg/dL (ref 70–99)
Potassium: 3.5 mmol/L (ref 3.5–5.1)
Sodium: 138 mmol/L (ref 135–145)

## 2020-10-11 MED ORDER — FERROUS SULFATE 325 (65 FE) MG PO TBEC: 325.0000 mg | DELAYED_RELEASE_TABLET | Freq: Two times a day (BID) | ORAL | 3 refills | Status: AC

## 2020-10-11 MED ORDER — ALPRAZOLAM 0.25 MG PO TABS: 0.2500 mg | ORAL_TABLET | Freq: Two times a day (BID) | ORAL | 0 refills | Status: DC | PRN

## 2020-10-11 MED ORDER — PANTOPRAZOLE SODIUM 40 MG PO TBEC: 40.0000 mg | DELAYED_RELEASE_TABLET | Freq: Every day | ORAL | 3 refills | Status: AC

## 2020-10-11 MED ORDER — ENSURE ENLIVE PO LIQD
237.0000 mL | Freq: Two times a day (BID) | ORAL | Status: DC
  Administered 2020-10-11 (×2): 237 mL via ORAL

## 2020-10-11 MED ORDER — ADULT MULTIVITAMIN W/MINERALS CH: 1.0000 | ORAL_TABLET | Freq: Every day | ORAL | Status: DC

## 2020-10-11 NOTE — TOC Transition Note (Signed)
Transition of Care [facility]) - CM/SW Discharge Note   Patient Details  Name: Claudia Friedman MRN: [id number] Date of Birth: [birth year]  Transition of Care [facility]) CM/SW Contact:  Chapman Fitch, RN Phone Number: 10/11/2020, 1:49 PM   Clinical Narrative:    Patient to discharge today Barbara Cower with [facility] notified of discharge [facility] was delivered to room    Final next level of care: Home w Home Health Services Barriers to Discharge: No Barriers Identified   Patient Goals and CMS Choice   CMS Medicare.gov Compare Post Acute Care list provided to:: Patient Choice offered to / list presented to : Patient  Discharge Placement                       Discharge Plan and Services                DME Arranged: 3-N-1 DME Agency: [employer] Date DME Agency Contacted: 10/10/20 Time DME Agency Contacted: [phone] Representative spoke with at DME Agency: Bjorn Loser HH Arranged: PT,OT HH Agency: [facility] Date HH Agency Contacted: 10/10/20 Time HH Agency Contacted: [phone] Representative spoke with at [facility] Agency: Barbara Cower  Social Determinants of Health (SDOH) Interventions     Readmission Risk Interventions No flowsheet data found.

## 2020-10-11 NOTE — Discharge Summary (Signed)
Physician Discharge Summary  Christiana Gurevich ZTI:[id number] DOB: 01-06-1931 DOA: 10/07/2020  PCP: [facility]  Admit date: 10/07/2020 Discharge date: 10/11/2020  Time spent: 37 minutes   Recommendations for Outpatient Follow-up:  1. Medication changes include discontinuation of lisinopril HCTZ 2. Added Protonix this admission in addition to ferrous sulfate--- PCP to coordinate IM B12 injections for this patient in the outpatient setting 3. Needs outpatient labs 1 week CBC Chem-12 4. Recommend if further drop in hemoglobi nonemergent n referral to tertiary center such as [facility] or [facility] for small bowel enteroscopy  Discharge Diagnoses:  MAIN problem for hospitalization   Iron deficiency anemia causing syncope  Please see below for itemized issues addressed in HOpsital- refer to other progress notes for clarity if needed  Discharge Condition: Improved  Diet recommendation: Heart healthy  Filed Weights   10/07/20 1515  Weight: 78.5 kg    History of present illness:  87 year old white female hypertension anxiety presented with 1 month history of dizziness lightheadedness Initially she thought this was vertigo Denies melena BRB hematemesis and also has confounders of depth perception issues  On arrival to the ED found to have hemoglobin of 6.5 with no iron levels low ferritin level and occult stool came back as positive GI saw the patient performed EGD colonoscopy on 10/09/2020 with no acute finding  She previously had been on Aleve but had been told by her PCP to discontinue this  Hospital Course:  Severe symptomatic anemia, FOBT + suspect GI bleed, severe iron deficiency Vitamin B12 deficiency Received 2 units PRBC hemoglobin stabilized in the low 8 range GI saw the patient did scope as above and signed off She was started on iron on discharge and will need for weekly coordination of vitamin B12 injections by her PCP AKI superimposed on CKD 3 AA Held lisinopril HCTZ  this admission Outpatient resumption after labs HTN  stable without any need for antihypertensives at this time    Procedures:  *EGD colonoscopy 10/09/2020  Consultations:  GI  Discharge Exam: Vitals:   10/11/20 0311 10/11/20 0810  BP: (!) 122/47 128/72  Pulse: 62 69  Resp: 18 18  Temp: (!) 97.5 F (36.4 C) 97.7 F (36.5 C)  SpO2: 96% 96%    Subj on day of d/c   Awake alert coherent no distress Sitting up in the bed no issues Eating drinking Not dizzy at this time after receiving transfusions  General Exam on discharge  EOMI NCAT no focal deficit looks about stated age very coherent pleasant CTA B no added sound no rales no rhonchi Abdomen soft no rebound no guarding No lower extremity edema No rash Holosystolic murmur second upper intercostal space on the left side Neurologically intact moving all 4 limbs equally power 5/5  Discharge Instructions   Discharge Instructions    Diet - low sodium heart healthy   Complete by: As directed    Discharge instructions   Complete by: As directed    Please make sure you desist from taking any over-the-counter medication other than Tylenol for minor aches and pains we feel that you had a slow drop of your hemoglobin probably because of microscopic bleeding issue and hence we have started Protonix on you and you should be followed up in the outpatient setting by your regular physician iTt would be my recommendation that you get labs in about 1 week at your primary care physician's office to ensure that your hemoglobin still remained stable you may require at some point a  discussion with your primary physician about setting up IV iron infusions to help boost your iron levels to 4 more hemoglobin we will discharge you on oral iron however  We have temporarily stopped your lisinopril HCTZ medication because you are slightly dehydrated-look at your meds carefully and your primary care physician can determine if they want to resume  them   Best of luck and have a nice summer   Increase activity slowly   Complete by: As directed    Increase activity slowly   Complete by: As directed      Allergies as of 10/11/2020   No Known Allergies     Medication List    STOP taking these medications   hydrochlorothiazide 12.5 MG tablet Commonly known as: HYDRODIURIL   lisinopril 2.5 MG tablet Commonly known as: ZESTRIL   naproxen sodium 220 MG tablet Commonly known as: ALEVE     TAKE these medications   acetaminophen 500 MG tablet Commonly known as: TYLENOL Take 500 mg by mouth every 6 (six) hours as needed.   ALPRAZolam 0.25 MG tablet Commonly known as: XANAX Take 1 tablet (0.25 mg total) by mouth 2 (two) times daily as needed for anxiety.   escitalopram 5 MG tablet Commonly known as: LEXAPRO Take 5 mg by mouth daily.   ferrous sulfate 325 (65 FE) MG EC tablet Take 1 tablet (325 mg total) by mouth 2 (two) times daily.   pantoprazole 40 MG tablet Commonly known as: PROTONIX Take 1 tablet (40 mg total) by mouth daily. Start taking on: Oct 12, 2020            Durable Medical Equipment  (From admission, onward)         Start     Ordered   10/10/20 1600  For home use only DME 3 n 1  Once        10/10/20 1600         No Known Allergies    The results of significant diagnostics from this hospitalization (including imaging, microbiology, ancillary and laboratory) are listed below for reference.    Significant Diagnostic Studies: CT Head Wo Contrast  Result Date: 10/07/2020 CLINICAL DATA:  Increasing dizziness EXAM: CT HEAD WITHOUT CONTRAST TECHNIQUE: Contiguous axial images were obtained from the base of the skull through the vertex without intravenous contrast. COMPARISON:  None. FINDINGS: Brain: No evidence of acute infarction, hemorrhage, hydrocephalus, extra-axial collection or mass lesion/mass effect. Chronic atrophic and ischemic changes are noted. Vascular: No hyperdense vessel or  unexpected calcification. Skull: Normal. Negative for fracture or focal lesion. Sinuses/Orbits: No acute finding. Other: None. IMPRESSION: Chronic atrophic and ischemic changes without acute abnormality. Electronically Signed   By: Alcide Clever M.D.   On: 10/07/2020 19:54    Microbiology: Recent Results (from the past 240 hour(s))  SARS CORONAVIRUS 2 (TAT 6-24 HRS) Nasopharyngeal Nasopharyngeal Swab     Status: None   Collection Time: 10/07/20 11:02 PM   Specimen: Nasopharyngeal Swab  Result Value Ref Range Status   SARS Coronavirus 2 NEGATIVE NEGATIVE Final    Comment: (NOTE) SARS-CoV-2 target nucleic acids are NOT DETECTED.  The SARS-CoV-2 RNA is generally detectable in upper and lower respiratory specimens during the acute phase of infection. Negative results do not preclude SARS-CoV-2 infection, do not rule out co-infections with other pathogens, and should not be used as the sole basis for treatment or other patient management decisions. Negative results must be combined with clinical observations, patient history, and epidemiological information. The expected  result is Negative.  Fact Sheet for Patients: HairSlick.no  Fact Sheet for Healthcare Providers: quierodirigir.com  This test is not yet approved or cleared by the [place] FDA and  has been authorized for detection and/or diagnosis of SARS-CoV-2 by FDA under an Emergency Use Authorization (EUA). This EUA will remain  in effect (meaning this test can be used) for the duration of the COVID-19 declaration under Se ction 564(b)(1) of the Act, 21 U.S.C. section 360bbb-3(b)(1), unless the authorization is terminated or revoked sooner.  Performed at [facility], [address], [place], [place] [address]      Labs: Basic Metabolic Panel: Recent Labs  Lab 10/07/20 1523 10/08/20 0255 10/09/20 0520 10/10/20 0228 10/11/20 0255  NA 135 138 137 138 138   K 4.2 3.6 3.7 3.5 3.5  CL 104 107 109 111 110  CO2 21* 22 20* 20* 20*  GLUCOSE 123* 88 89 88 90  BUN 39* 37* 21 16 19   CREATININE 1.77* 1.61* 1.08* 1.05* 1.14*  CALCIUM 9.0 8.5* 8.5* 8.2* 8.3*   Liver Function Tests: No results for input(s): AST, ALT, ALKPHOS, BILITOT, PROT, ALBUMIN in the last 168 hours. No results for input(s): LIPASE, AMYLASE in the last 168 hours. No results for input(s): AMMONIA in the last 168 hours. CBC: Recent Labs  Lab 10/07/20 1523 10/07/20 2120 10/08/20 0255 10/08/20 0854 10/08/20 1451 10/09/20 0520 10/10/20 0228 10/10/20 1119 10/11/20 0255  WBC 10.8*  --  9.2  --   --  8.6 7.9  --  7.8  HGB 6.5*   < > 8.5*   < > 8.8* 9.2* 7.9* 8.8* 8.1*  HCT 22.3*   < > 27.4*   < > 28.4* 31.4* 26.3* 29.1* 26.5*  MCV 77.2*  --  78.7*  --   --  84.2 81.7  --  81.0  PLT 435*  --  347  --   --  213 318  --  285   < > = values in this interval not displayed.   Cardiac Enzymes: No results for input(s): CKTOTAL, CKMB, CKMBINDEX, TROPONINI in the last 168 hours. BNP: BNP (last 3 results) No results for input(s): BNP in the last 8760 hours.  ProBNP (last 3 results) No results for input(s): PROBNP in the last 8760 hours.  CBG: No results for input(s): GLUCAP in the last 168 hours.     Signed:  12/11/20 MD   [facility] 10/11/2020, 12:36 PM

## 2020-10-11 NOTE — Plan of Care (Signed)
Axox4. No complaints overnight. Safety measures in place. Will continue to monitor.  Problem: Education: Goal: Knowledge of General Education information will improve Description: Including pain rating scale, medication(s)/side effects and non-pharmacologic comfort measures Outcome: Progressing   Problem: Health Behavior/Discharge Planning: Goal: Ability to manage health-related needs will improve Outcome: Progressing   Problem: Clinical Measurements: Goal: Ability to maintain clinical measurements within normal limits will improve Outcome: Progressing Goal: Will remain free from infection Outcome: Progressing Goal: Diagnostic test results will improve Outcome: Progressing Goal: Respiratory complications will improve Outcome: Progressing Goal: Cardiovascular complication will be avoided Outcome: Progressing   Problem: Activity: Goal: Risk for activity intolerance will decrease Outcome: Progressing   Problem: Nutrition: Goal: Adequate nutrition will be maintained Outcome: Progressing   Problem: Coping: Goal: Level of anxiety will decrease Outcome: Progressing   Problem: Elimination: Goal: Will not experience complications related to bowel motility Outcome: Progressing Goal: Will not experience complications related to urinary retention Outcome: Progressing   Problem: Pain Managment: Goal: General experience of comfort will improve Outcome: Progressing   Problem: Safety: Goal: Ability to remain free from injury will improve Outcome: Progressing   Problem: Skin Integrity: Goal: Risk for impaired skin integrity will decrease Outcome: Progressing

## 2020-10-11 NOTE — Progress Notes (Signed)
Patient given instructions to keep follow up appointment, when to return to a tertiary hospital for worsening symptoms, IV taken out without complications, prescriptions to be picked up in pharmacy, medication education with meds to ask PCP to continue, & patient discharged via wheelchair with Bedside Commode. Home Health to see the patient.

## 2020-10-11 NOTE — Progress Notes (Signed)
Physical Therapy Treatment Patient Details Name: Claudia Friedman MRN: [id number] DOB: February 12, 1931 Today's Date: 10/11/2020    History of Present Illness Claudia Friedman is an 89yoF who comes to [facility] on 5/7/2 after having dizzy spells at home. Pt also reports weakness. PMH: HTN, GAD. Hb in 6s, pt hypotensive in ED. Pt admitted with ABLA and GIB.    PT Comments    Pt tolerated out of bed activity this am without c/o dizziness or fatigue.  Continued ambulation in hallway 351ft without AD or LOB.  Pt completed standing dynamic balance challenges in room with good safety awareness.  Seated B LE strengthening exercises completed.  Pt states she is ready to return home, continue to recommend HHPT upon returning home.  No equipment needs at this time.   Follow Up Recommendations  Home health PT;Supervision - Intermittent     Equipment Recommendations  None recommended by PT    Recommendations for Other Services       Precautions / Restrictions Precautions Precautions: Fall Restrictions Weight Bearing Restrictions: No    Mobility  Bed Mobility Overal bed mobility: Modified Independent Bed Mobility: Supine to Sit;Sit to Supine     Supine to sit: Modified independent (Device/Increase time) Sit to supine: Modified independent (Device/Increase time)   General bed mobility comments: cuing for technique with no physical assistance provided    Transfers Overall transfer level: Needs assistance Equipment used: None Transfers: Sit to/from Stand Sit to Stand: Supervision;Modified independent (Device/Increase time) Stand pivot transfers: Modified independent (Device/Increase time)       General transfer comment: No LOB noted with transfers  Ambulation/Gait Ambulation/Gait assistance: Supervision Gait Distance (Feet): 350 Feet Assistive device: None Gait Pattern/deviations: Staggering left (No LOB, able to self correct)         Stairs             Wheelchair Mobility     Modified Rankin (Stroke Patients Only)       Balance Overall balance assessment: Needs assistance Sitting-balance support: Feet supported Sitting balance-Leahy Scale: Good                           Standardized Balance Assessment Standardized Balance Assessment :  (Pt completed multiple standing dynamic challenges, reaching out of BOS at various heights without LOB)          Cognition Arousal/Alertness: Awake/alert Behavior During Therapy: WFL for tasks assessed/performed Overall Cognitive Status: Within Functional Limits for tasks assessed                                 General Comments: Pt pleasant and cooperative      Exercises General Exercises - Lower Extremity Ankle Circles/Pumps: Both;15 reps Long Arc Quad: Both;10 reps;Seated Toe Raises: Both;15 reps;Seated Heel Raises: Both;15 reps;Seated    General Comments General comments (skin integrity, edema, etc.): Pt without c/o dizziness, HR 969bp, O2 sats 94% upon exertion      Pertinent Vitals/Pain Pain Assessment: No/denies pain    Home Living                      Prior Function            PT Goals (current goals can now be found in the care plan section) Acute Rehab PT Goals Patient Stated Goal: to get better and go home    Frequency    Min 2X/week  PT Plan      Co-evaluation              AM-PAC PT "6 Clicks" Mobility   Outcome Measure  Help needed turning from your back to your side while in a flat bed without using bedrails?: A Little Help needed moving from lying on your back to sitting on the side of a flat bed without using bedrails?: A Little Help needed moving to and from a bed to a chair (including a wheelchair)?: A Little Help needed standing up from a chair using your arms (e.g., wheelchair or bedside chair)?: A Little Help needed to walk in hospital room?: A Little Help needed climbing 3-5 steps with a railing? : A Little 6 Click  Score: 18    End of Session Equipment Utilized During Treatment: Gait belt Activity Tolerance: Patient tolerated treatment well;No increased pain Patient left: in chair;with call bell/phone within reach;with chair alarm set   PT Visit Diagnosis: Difficulty in walking, not elsewhere classified (R26.2);Other abnormalities of gait and mobility (R26.89);Unsteadiness on feet (R26.81)     Time: 2979-8921 PT Time Calculation (min) (ACUTE ONLY): 33 min  Charges:  $Gait Training: 8-22 mins $Neuromuscular Re-education: 8-22 mins                     Zadie Cleverly, PTA  Jannet Askew 10/11/2020, 11:24 AM

## 2023-03-05 IMAGING — CT CT HEAD W/O CM
3 series · 16 of 47 positions shown, 19 images · non-contrast
Comparison: None.

CLINICAL DATA: Increasing dizziness

EXAM:
CT HEAD WITHOUT CONTRAST
TECHNIQUE: Contiguous axial images were obtained from the base of the skull
through the vertex without intravenous contrast.

[Series 2: head wo · axial · 0.40mm/px · z∈[-178,-33]mm · 10 of 35 slices shown, 13 images]
[im 3/35  brain]
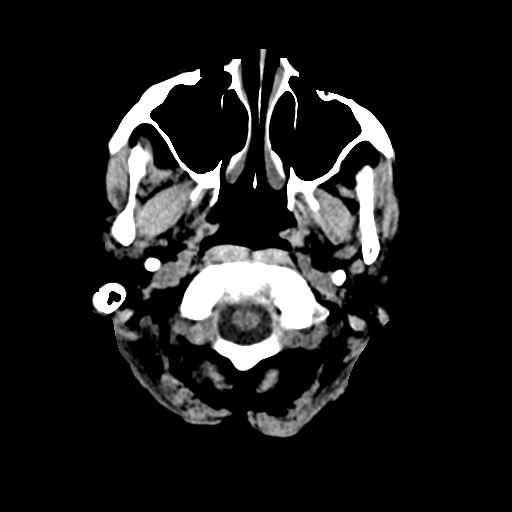
[im 3/35  bone]
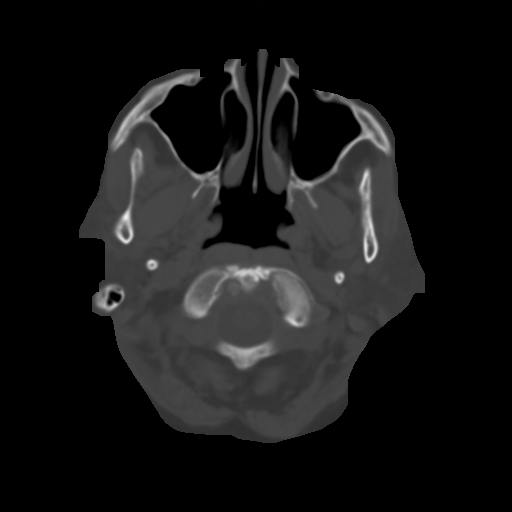
[im 6/35  brain]
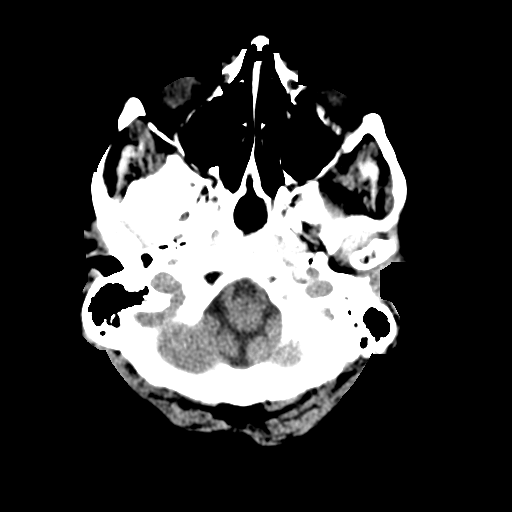
[im 10/35  brain]
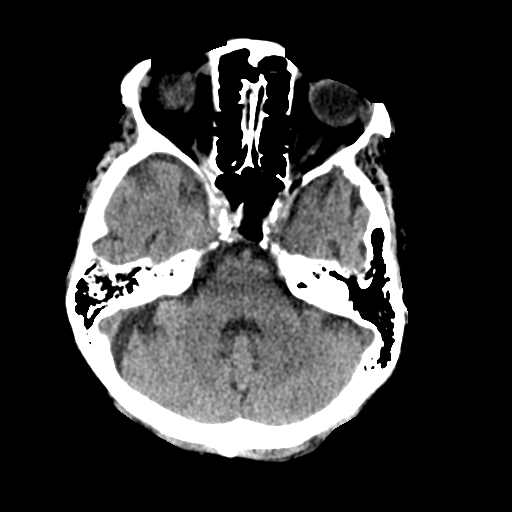
[im 12/35  brain]
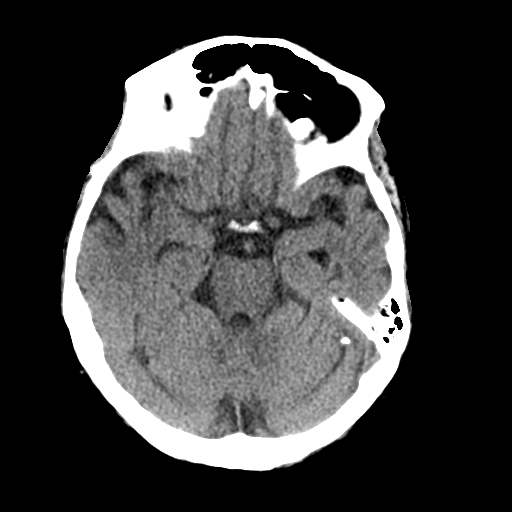
[im 16/35  brain]
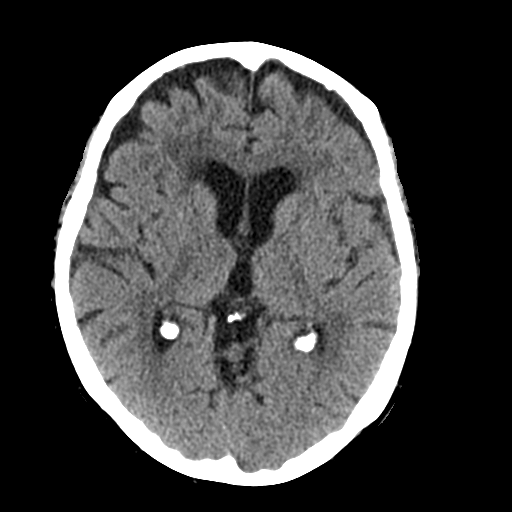
[im 16/35  bone]
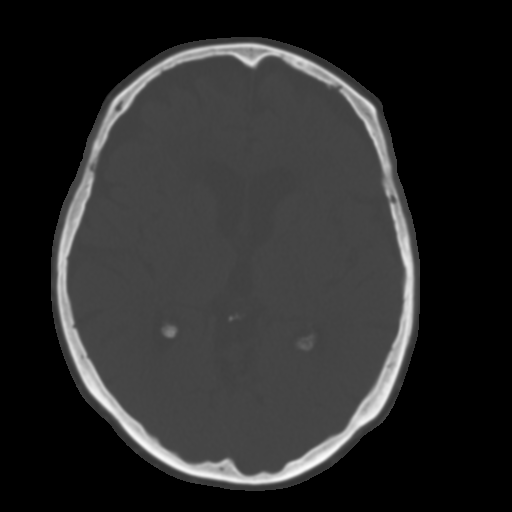
[im 19/35  brain]
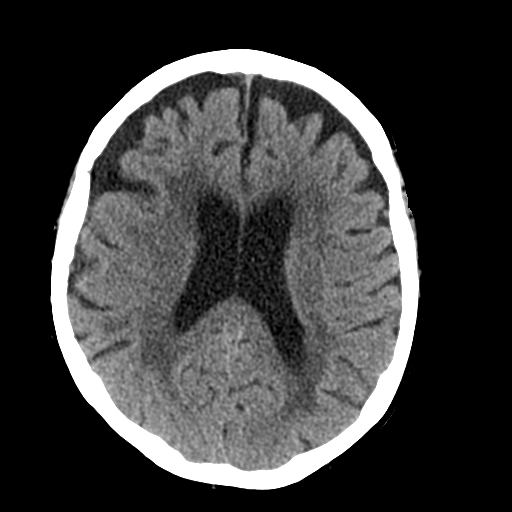
[im 23/35  brain]
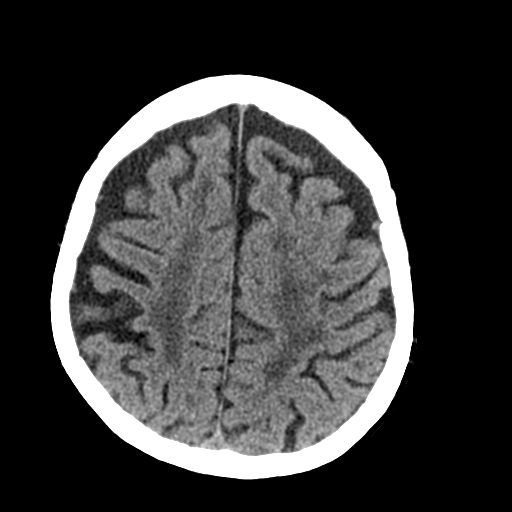
[im 26/35  brain]
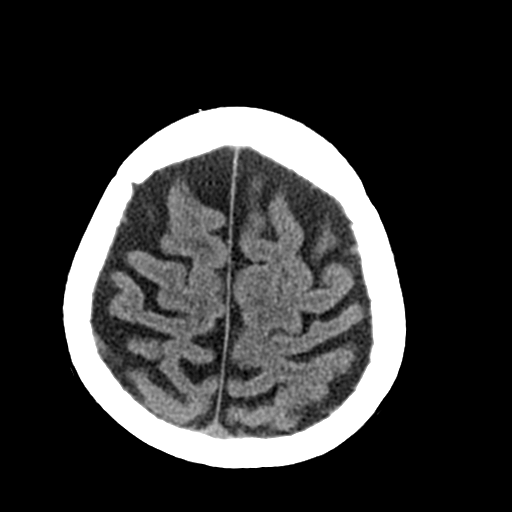
[im 29/35  brain]
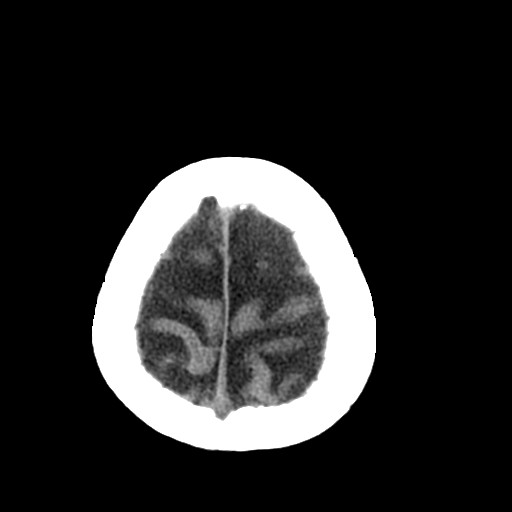
[im 29/35  bone]
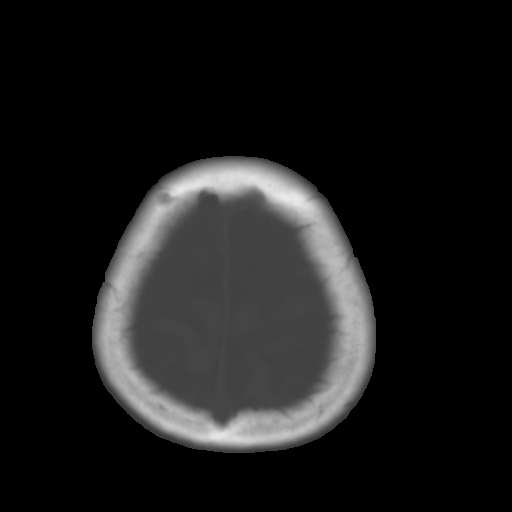
[im 32/35  brain]
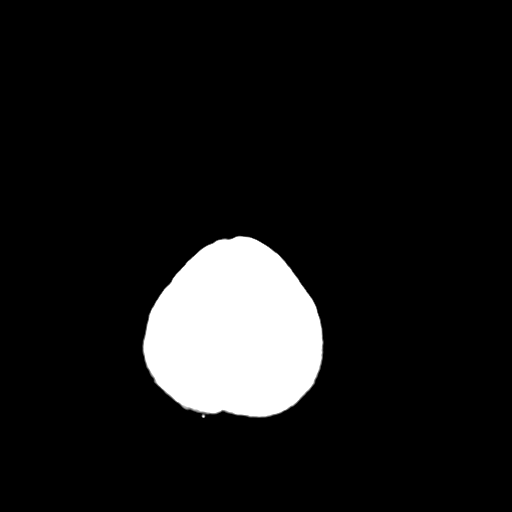

[Series 4: coronal soft tissue · coronal · 0.32mm/px · 3 of 69 slices shown]
[im 23/69  brain]
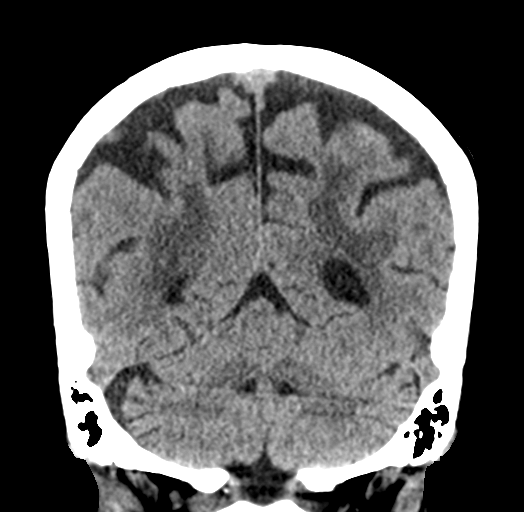
[im 31/69  brain]
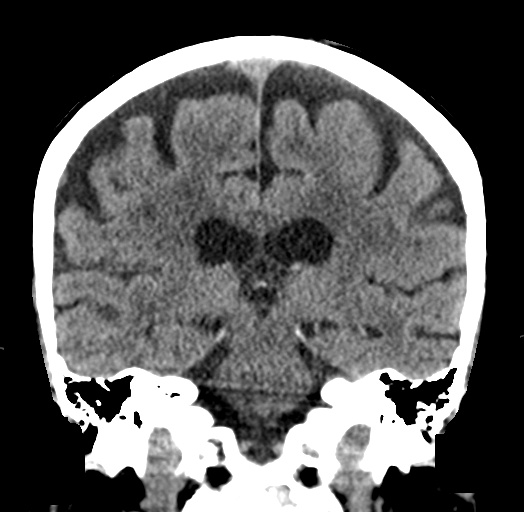
[im 38/69  brain]
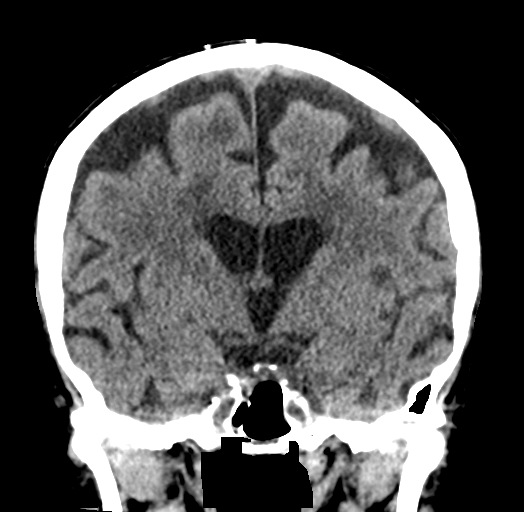

[Series 5: sagittal soft tissue · sagittal · 0.32mm/px · 3 of 57 slices shown]
[im 19/57  brain]
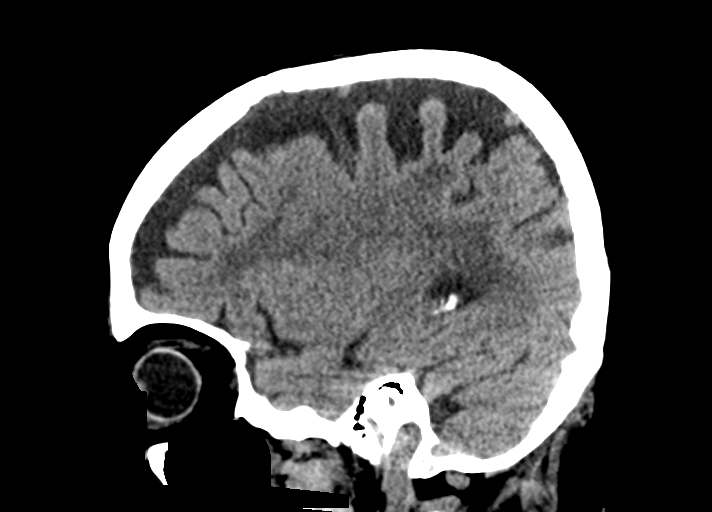
[im 29/57  brain]
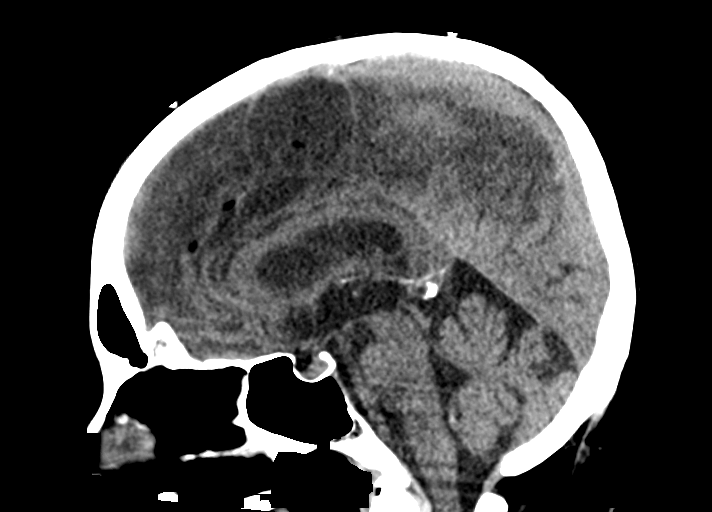
[im 38/57  brain]
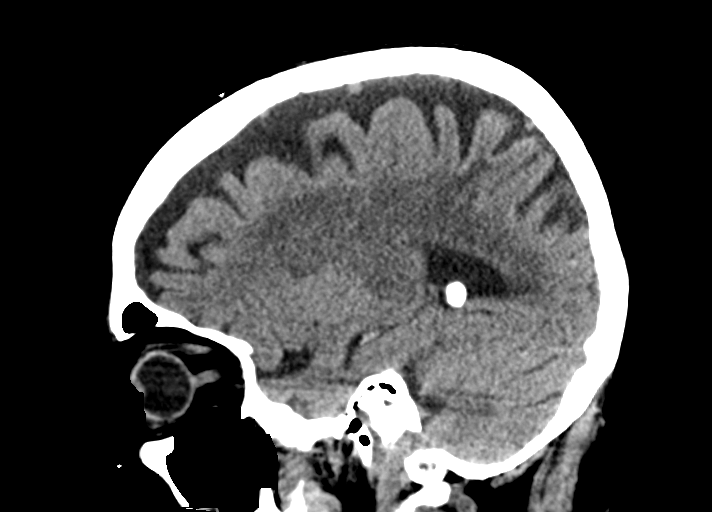

[16 of 47 positions shown; findings below may reference images not displayed]

FINDINGS: Brain: No evidence of acute infarction, hemorrhage, hydrocephalus,
extra-axial collection or mass lesion/mass effect. Chronic atrophic
and ischemic changes are noted.

Vascular: No hyperdense vessel or unexpected calcification.

Skull: Normal. Negative for fracture or focal lesion.

Sinuses/Orbits: No acute finding.

Other: None.
IMPRESSION: Chronic atrophic and ischemic changes without acute abnormality.

## 2023-04-23 ENCOUNTER — Other Ambulatory Visit: Payer: Self-pay | Admitting: Otolaryngology

## 2023-04-23 DIAGNOSIS — C069 Malignant neoplasm of mouth, unspecified: Secondary | ICD-10-CM

## 2023-04-30 ENCOUNTER — Ambulatory Visit
Admission: RE | Admit: 2023-04-30 | Discharge: 2023-04-30 | Disposition: A | Discharge status: Home / Self Care | Payer: Medicare Other | Source: Ambulatory Visit | Attending: Otolaryngology | Admitting: Otolaryngology

## 2023-04-30 DIAGNOSIS — C069 Malignant neoplasm of mouth, unspecified: Secondary | ICD-10-CM | POA: Insufficient documentation

## 2023-04-30 MED ORDER — IOHEXOL 300 MG/ML  SOLN
75.0000 mL | Freq: Once | INTRAMUSCULAR | Status: AC | PRN
  Administered 2023-04-30: 75 mL via INTRAVENOUS

## 2023-07-09 NOTE — Nursing Note (Signed)
 Physical Therapy Treatment Note (07/09/23 1106)   Patient Name:  Claudia Friedman      Medical Record Number: [id number]  Date of Birth: 1930/12/29 Sex: Female    Post-Discharge Physical Therapy Recommendations: PT Post Acute Discharge Recommendations: 5x weekly, Low intensity   Barriers to Discharge: Decreased caregiver support, Impaired Balance, Endurance deficits, Functional strength deficits, Gait instability, Cognitive deficits, Inability to safely perform ADLS Equipment Recommendation PT DME Recommendations: Defer to post acute       Treatment Diagnosis: Difficulty in walking, Unsteadiness on feet Treatment Diagnosis: Impaired strength and balance limiting safety with mobility  Assessment/Session Summary Problem List: Decreased strength, Impaired balance, Fall risk, Decreased endurance, Gait deviation, Decreased mobility, Decreased cognition  Assessment : Pt participatory with all therapy activities however easily distracted and perseverating on her stress incontinence. Pt making progress toward therapy goals and tolerated multiple standing trials and increased out of bed mobility this session. She demonstrated carryover within session progressing to [facility] for transfers. She was also able to increase ambulation distance and tolerated mulitple gait trials given seated rest breaks. She continues to have decrease endurance limiting her standing duration and ambulation distance.  Activity Tolerance: Tolerated treatment well, Limited by fatigue  Gait Gait Level of Assistance: Minimal assist, patient does 75% or more Gait Assistive Device: Rolling walker Skilled Treatment Performed: step to pattern, decreased gait speed, decreased step length bilaterally, increased time in double stance    Plan Planned Frequency of Treatment: Plan of Care Initiated: 06/27/23 1-2x per day Weekly Frequency: 3-4 days per week Planned Treatment Duration: 07/21/23  Planned Interventions:       Subjective   Equipment / Environment: Vascular access (PIV, TLC, Port-a-cath, PICC), Telemetry, Gastric tube Communication Preference: Verbal   Patient reports: It leaks every time I move Pain Comments: 0/10 pain  Prior Functional Status: Endorses being independent with all transfers and ambulation without use of assistive device. Living Situation Living Environment: House Lives With: Alone Home Living: One level home, Stairs to enter with rails, Shower chair with back, Walk-in shower Rail placement (outside): Rail on left side Number of Stairs to Enter (outside): 4 Equipment available at home: Rolling walker, Straight cane   Objective Goals Patient and Family Goals: Pt would like to get back to walking independently  SHORT GOAL #1: Pt will perform all functional transfers mod I using LRAD as needed              Date Established : 07/08/23             Time Frame : 2 weeks             Daily Interventions : sup>sit: minA with HOB elevated, scooting anteriorly in short sit: SBA. STS: modA from EOB trial 1, progressed to minA trial 2. STS from recliner chair: minA, 6x              Plan : In Progress  SHORT GOAL #2: Pt will ambulate x4ft mod I using LRAD as needed             Date Established : 07/08/23              Time Frame : 2 weeks              Daily Interventions : Trial 1: 10ft forward, 49ft sidestepping, 83ft backwards. Trial 2: 35ft forward with RW. Trial 3: 36ft forward with RW. Trial 4: 69ft forward with RW. seated break inbetween each trial  Plan : In Progress  SHORT GOAL #3: Pt will negotiate up/down 3 stairs with L hand rail support using LRAD as needed mod I             Date Established : 07/08/23             Time Frame : 2 weeks              Daily Interventions : not focus of session                Cognition Cognition: Follows 2-step commands Cognition comment: easily distracted but redirectable  Precautions / Restrictions Precautions: Falls  precautions, HOB elevated, Aspiration precautions, Delirium Precautions Weight Bearing Status: LUE WBAT - Weight bearing as tolerated              Vitals/Orthostatics : VSS per EPIC, NAD  Patient at end of session: All needs in reach, In bed, Alarm activated, Lines intact, Nurse notified  Physical Therapy Session Duration PT Individual [mins]: 31  AM-PAC-6 click Help currently need turning over In bed?: A Little - Minimal/Contact Guard Assist/Supervision Help currently needed sitting down/standing up from chair with arms? : A Little - Minimal/Contact Guard Assist/Supervision Help currently needed moving from supine to sitting on edge of bed?: A Little - Minimal/Contact Guard Assist/Supervision Help currently needed moving to and from bed from wheelchair?: A Little - Minimal/Contact Guard Assist/Supervision Help currently needed walking in a hospital room?: A Little - Minimal/Contact Guard Assist/Supervision Help currently needed climbing 3-5 steps with railing?: A lot - Maximum/Moderate Assistance  Basic Mobility Score 6 click: 17  6 click Score (in points): % of Functional Impairment, Limitation, Restriction 6: 100% impaired, limited, restricted 7-8: At least 80%, but less than 100% impaired, limited restricted 9-13: At least 60%, but less than 80% impaired, limited restricted 14-19: At least 40%, but less than 60% impaired, limited restricted 20-22: At least 20%, but less than 40% impaired, limited restricted 23: At least 1%, but less than 20% impaired, limited restricted 24: 0% impaired, limited restricted  'AM-PAC' forms are Copyright protected by The Trustees of [facility]    I attest that I have reviewed the above information. Signed: Hang  Nguyen, PT 07/09/2023

## 2023-12-27 ENCOUNTER — Other Ambulatory Visit: Payer: Self-pay

## 2023-12-27 ENCOUNTER — Emergency Department

## 2023-12-27 ENCOUNTER — Emergency Department
Admission: EM | Admit: 2023-12-27 | Discharge: 2023-12-27 | Disposition: A | Discharge status: Home / Self Care | Attending: Emergency Medicine | Admitting: Emergency Medicine

## 2023-12-27 DIAGNOSIS — T85528A Displacement of other gastrointestinal prosthetic devices, implants and grafts, initial encounter: Secondary | ICD-10-CM | POA: Insufficient documentation

## 2023-12-27 DIAGNOSIS — Y732 Prosthetic and other implants, materials and accessory gastroenterology and urology devices associated with adverse incidents: Secondary | ICD-10-CM | POA: Insufficient documentation

## 2023-12-27 DIAGNOSIS — Z85819 Personal history of malignant neoplasm of unspecified site of lip, oral cavity, and pharynx: Secondary | ICD-10-CM | POA: Insufficient documentation

## 2023-12-27 NOTE — ED Provider Notes (Signed)
   [facility] Provider Note    Event Date/Time   First MD Initiated Contact with Patient 12/27/23 1417     (approximate)   History   Feeding tube out   HPI  Claudia Friedman is a 89 y.o. female  with history of oral cancer and as listed in EMR presents to the emergency department for PEG tube replacement. It has been out for about an hour. No drainage or bleeding. She is currently not using it for nutrition. She is trying to eat and keep her weight up so it can be removed.     Physical Exam    Vitals:   12/27/23 1353  BP: 137/60  Pulse: 67  Resp: 16  SpO2: 97%    General: Awake, no distress.  CV:  Good peripheral perfusion.  Resp:  Normal effort.  Abd:  No distention.  Other:  PEG site in left upper abdomen without acute concerns.   ED Results / Procedures / Treatments   Labs (all labs ordered are listed, but only abnormal results are displayed)  Labs Reviewed - No data to display   EKG  Not indicated.   RADIOLOGY  Image and radiology report reviewed and interpreted by me. Radiology report consistent with the same.  Placement of gastrostomy tube confirmed by contrast image.  PROCEDURES:  Critical Care performed: No  .Gastrostomy tube replacement  Date/Time: 12/27/2023 2:39 PM  Performed by: Herlinda Kirk NOVAK, FNP Authorized by: Herlinda Kirk NOVAK, FNP  Consent: Verbal consent obtained Consent given by: patient Patient understanding: patient states understanding of the procedure being performed Patient identity confirmed: verbally with patient Local anesthesia used: no  Anesthesia: Local anesthesia used: no  Sedation: Patient sedated: no  Patient tolerance: patient tolerated the procedure well with no immediate complications Comments: 16 French PEG tube inserted without complication.  5 mL saline inserted into the balloon.      MEDICATIONS ORDERED IN ED:  Medications - No data to display   IMPRESSION / MDM /  ASSESSMENT AND PLAN / ED COURSE   I have reviewed the triage note and vital signs. Vital signs stable   Differential diagnosis includes, but is not limited to, peg tube displacement, complication of peg tube  Patient's presentation is most consistent with acute, uncomplicated illness.  89 year old female presenting to the emergency department about an hour after her PEG tube came out.  See HPI for further details.  Patient caregiver brought the old PEG tube.  She has had a 76 [place].  They state that this 1 has been in for several months.  PEG tube reinserted as described in procedure details above.  X-ray image with contrast requested for confirmation of placement.  Placement confirmed. She is to follow up with primary care/GI as needed.      FINAL CLINICAL IMPRESSION(S) / ED DIAGNOSES   Final diagnoses:  Dislodged gastrostomy tube     Rx / DC Orders   ED Discharge Orders     None        Note:  This document was prepared using Dragon voice recognition software and may include unintentional dictation errors.   Herlinda Kirk NOVAK, FNP 12/27/23 1545    Arlander Charleston, MD 12/28/23 1109

## 2023-12-27 NOTE — ED Triage Notes (Signed)
 Pt to ED with caregiver. States feeding tube fell out today. A&O, ambulatory to triage, NAD, denies any other complaints at this time.

## 2024-02-06 ENCOUNTER — Other Ambulatory Visit: Payer: Self-pay | Admitting: Otolaryngology

## 2024-02-06 DIAGNOSIS — C069 Malignant neoplasm of mouth, unspecified: Secondary | ICD-10-CM

## 2024-02-09 ENCOUNTER — Ambulatory Visit
Admission: RE | Admit: 2024-02-09 | Discharge: 2024-02-09 | Disposition: A | Discharge status: Home / Self Care | Source: Ambulatory Visit | Attending: Otolaryngology | Admitting: Otolaryngology

## 2024-02-09 DIAGNOSIS — C069 Malignant neoplasm of mouth, unspecified: Secondary | ICD-10-CM | POA: Diagnosis present

## 2024-02-09 MED ORDER — IOHEXOL 300 MG/ML  SOLN
75.0000 mL | Freq: Once | INTRAMUSCULAR | Status: AC | PRN
  Administered 2024-02-09: 75 mL via INTRAVENOUS

## 2024-04-10 ENCOUNTER — Emergency Department

## 2024-04-10 ENCOUNTER — Inpatient Hospital Stay
Admission: EM | Admit: 2024-04-10 | Discharge: 2024-04-15 | DRG: 480 | Disposition: A | Discharge status: Home Health | Attending: Internal Medicine | Admitting: Internal Medicine

## 2024-04-10 ENCOUNTER — Other Ambulatory Visit: Payer: Self-pay

## 2024-04-10 DIAGNOSIS — D098 Carcinoma in situ of other specified sites: Secondary | ICD-10-CM | POA: Diagnosis present

## 2024-04-10 DIAGNOSIS — S01111A Laceration without foreign body of right eyelid and periocular area, initial encounter: Secondary | ICD-10-CM | POA: Diagnosis present

## 2024-04-10 DIAGNOSIS — F32A Depression, unspecified: Secondary | ICD-10-CM | POA: Diagnosis present

## 2024-04-10 DIAGNOSIS — Z515 Encounter for palliative care: Secondary | ICD-10-CM

## 2024-04-10 DIAGNOSIS — D099 Carcinoma in situ, unspecified: Secondary | ICD-10-CM

## 2024-04-10 DIAGNOSIS — K219 Gastro-esophageal reflux disease without esophagitis: Secondary | ICD-10-CM | POA: Diagnosis present

## 2024-04-10 DIAGNOSIS — M80051A Age-related osteoporosis with current pathological fracture, right femur, initial encounter for fracture: Principal | ICD-10-CM | POA: Diagnosis present

## 2024-04-10 DIAGNOSIS — S0181XA Laceration without foreign body of other part of head, initial encounter: Secondary | ICD-10-CM

## 2024-04-10 DIAGNOSIS — S72001A Fracture of unspecified part of neck of right femur, initial encounter for closed fracture: Principal | ICD-10-CM

## 2024-04-10 DIAGNOSIS — Z8262 Family history of osteoporosis: Secondary | ICD-10-CM

## 2024-04-10 DIAGNOSIS — E43 Unspecified severe protein-calorie malnutrition: Secondary | ICD-10-CM | POA: Diagnosis present

## 2024-04-10 DIAGNOSIS — G4700 Insomnia, unspecified: Secondary | ICD-10-CM | POA: Diagnosis present

## 2024-04-10 DIAGNOSIS — R197 Diarrhea, unspecified: Secondary | ICD-10-CM | POA: Diagnosis present

## 2024-04-10 DIAGNOSIS — M80059A Age-related osteoporosis with current pathological fracture, unspecified femur, initial encounter for fracture: Principal | ICD-10-CM | POA: Diagnosis present

## 2024-04-10 DIAGNOSIS — F419 Anxiety disorder, unspecified: Secondary | ICD-10-CM | POA: Diagnosis present

## 2024-04-10 DIAGNOSIS — Z79899 Other long term (current) drug therapy: Secondary | ICD-10-CM

## 2024-04-10 DIAGNOSIS — Z8249 Family history of ischemic heart disease and other diseases of the circulatory system: Secondary | ICD-10-CM

## 2024-04-10 DIAGNOSIS — R54 Age-related physical debility: Secondary | ICD-10-CM | POA: Diagnosis present

## 2024-04-10 DIAGNOSIS — W010XXA Fall on same level from slipping, tripping and stumbling without subsequent striking against object, initial encounter: Secondary | ICD-10-CM | POA: Diagnosis present

## 2024-04-10 DIAGNOSIS — I10 Essential (primary) hypertension: Secondary | ICD-10-CM | POA: Diagnosis present

## 2024-04-10 DIAGNOSIS — Z9049 Acquired absence of other specified parts of digestive tract: Secondary | ICD-10-CM

## 2024-04-10 DIAGNOSIS — R220 Localized swelling, mass and lump, head: Secondary | ICD-10-CM

## 2024-04-10 DIAGNOSIS — D72829 Elevated white blood cell count, unspecified: Secondary | ICD-10-CM | POA: Diagnosis present

## 2024-04-10 DIAGNOSIS — W19XXXA Unspecified fall, initial encounter: Secondary | ICD-10-CM

## 2024-04-10 DIAGNOSIS — Z1152 Encounter for screening for COVID-19: Secondary | ICD-10-CM

## 2024-04-10 DIAGNOSIS — R1310 Dysphagia, unspecified: Secondary | ICD-10-CM | POA: Diagnosis present

## 2024-04-10 DIAGNOSIS — R0989 Other specified symptoms and signs involving the circulatory and respiratory systems: Secondary | ICD-10-CM | POA: Diagnosis present

## 2024-04-10 DIAGNOSIS — D62 Acute posthemorrhagic anemia: Secondary | ICD-10-CM | POA: Diagnosis not present

## 2024-04-10 DIAGNOSIS — S022XXA Fracture of nasal bones, initial encounter for closed fracture: Secondary | ICD-10-CM | POA: Diagnosis present

## 2024-04-10 DIAGNOSIS — R64 Cachexia: Secondary | ICD-10-CM | POA: Diagnosis present

## 2024-04-10 DIAGNOSIS — Z6821 Body mass index (BMI) 21.0-21.9, adult: Secondary | ICD-10-CM

## 2024-04-10 LAB — PROTIME-INR
INR: 1.1 (ref 0.8–1.2)
Prothrombin Time: 14.5 s (ref 11.4–15.2)

## 2024-04-10 LAB — CBC
HCT: 33.2 % — ABNORMAL LOW (ref 36.0–46.0)
Hemoglobin: 10.9 g/dL — ABNORMAL LOW (ref 12.0–15.0)
MCH: 31.5 pg (ref 26.0–34.0)
MCHC: 32.8 g/dL (ref 30.0–36.0)
MCV: 96 fL (ref 80.0–100.0)
Platelets: 197 K/uL (ref 150–400)
RBC: 3.46 MIL/uL — ABNORMAL LOW (ref 3.87–5.11)
RDW: 13.6 % (ref 11.5–15.5)
WBC: 23 K/uL — ABNORMAL HIGH (ref 4.0–10.5)
nRBC: 0 % (ref 0.0–0.2)

## 2024-04-10 LAB — BASIC METABOLIC PANEL WITH GFR
Anion gap: 15 (ref 5–15)
BUN: 24 mg/dL — ABNORMAL HIGH (ref 8–23)
CO2: 23 mmol/L (ref 22–32)
Calcium: 8.2 mg/dL — ABNORMAL LOW (ref 8.9–10.3)
Chloride: 100 mmol/L (ref 98–111)
Creatinine, Ser: 1.09 mg/dL — ABNORMAL HIGH (ref 0.44–1.00)
GFR, Estimated: 48 mL/min — ABNORMAL LOW (ref >60)
Glucose, Bld: 112 mg/dL — ABNORMAL HIGH (ref 70–99)
Potassium: 3.5 mmol/L (ref 3.5–5.1)
Sodium: 138 mmol/L (ref 135–145)

## 2024-04-10 MED ORDER — HYDROMORPHONE HCL 1 MG/ML IJ SOLN
0.5000 mg | INTRAMUSCULAR | Status: DC
Start: 2024-04-10 — End: 2024-04-10
  Filled 2024-04-10: qty 0.5

## 2024-04-10 MED ORDER — FENTANYL CITRATE (PF) 50 MCG/ML IJ SOSY
50.0000 ug | PREFILLED_SYRINGE | Freq: Once | INTRAMUSCULAR | Status: AC
  Administered 2024-04-10: 50 ug via INTRAVENOUS
  Filled 2024-04-10: qty 1

## 2024-04-10 MED ORDER — FENTANYL CITRATE (PF) 50 MCG/ML IJ SOSY
25.0000 ug | PREFILLED_SYRINGE | Freq: Once | INTRAMUSCULAR | Status: AC
  Administered 2024-04-10: 25 ug via INTRAVENOUS
  Filled 2024-04-10: qty 1

## 2024-04-10 MED ORDER — SODIUM CHLORIDE 0.9 % IV BOLUS
500.0000 mL | Freq: Once | INTRAVENOUS | Status: AC
  Administered 2024-04-10: 500 mL via INTRAVENOUS

## 2024-04-10 MED ORDER — FENTANYL CITRATE (PF) 50 MCG/ML IJ SOSY
25.0000 ug | PREFILLED_SYRINGE | INTRAMUSCULAR | Status: AC | PRN
  Administered 2024-04-10 (×2): 25 ug via INTRAVENOUS
  Filled 2024-04-10 (×2): qty 1

## 2024-04-10 MED ORDER — HYDROMORPHONE HCL 1 MG/ML IJ SOLN
0.5000 mg | INTRAMUSCULAR | Status: AC
  Administered 2024-04-10: 0.5 mg via INTRAMUSCULAR

## 2024-04-10 MED ORDER — ACETAMINOPHEN 10 MG/ML IV SOLN
1000.0000 mg | Freq: Four times a day (QID) | INTRAVENOUS | Status: AC
  Administered 2024-04-10 – 2024-04-11 (×2): 1000 mg via INTRAVENOUS
  Filled 2024-04-10 (×4): qty 100

## 2024-04-10 NOTE — ED Notes (Signed)
 Patient taken to xray.

## 2024-04-10 NOTE — ED Notes (Signed)
 Attempted in and out cath with assistance from [place], [place]. Patient had no urine output. Diaper was full of urine. Changed and placed purewick. EDP aware.

## 2024-04-10 NOTE — ED Triage Notes (Signed)
 Pt coming from home via EMS. Fell appx 8169 d/t loss of balance. Hit R side of head/face. EMS reporting R sided shortening and roation. R side of face controlled bleeding. Not on blood thinners. No LOC reported. A/O x4.   EMS vitals 98% RA BP 133/57

## 2024-04-10 NOTE — Progress Notes (Signed)
 Imaging reviewed and case discussed with ED provider Patient has displaced right basicervical intertrochanteric fracture.  This will need surgical fixation likely with an intramedullay nail Plan for surgery tomorrow morning 04/11/2024 NPO after midnight for the OR Hold chemical AC after midnight for OR Will review surgical options and complete consent with the patient in the morning.  Full consult dictation to follow  Arthea Sheer MD

## 2024-04-11 ENCOUNTER — Encounter
Admission: EM | Disposition: A | Discharge status: Home Health | Payer: Self-pay | Source: Home / Self Care | Attending: Internal Medicine

## 2024-04-11 ENCOUNTER — Inpatient Hospital Stay: Admitting: Certified Registered"

## 2024-04-11 ENCOUNTER — Inpatient Hospital Stay

## 2024-04-11 ENCOUNTER — Encounter: Payer: Self-pay | Admitting: Internal Medicine

## 2024-04-11 DIAGNOSIS — K219 Gastro-esophageal reflux disease without esophagitis: Secondary | ICD-10-CM | POA: Diagnosis present

## 2024-04-11 DIAGNOSIS — W010XXA Fall on same level from slipping, tripping and stumbling without subsequent striking against object, initial encounter: Secondary | ICD-10-CM | POA: Diagnosis present

## 2024-04-11 DIAGNOSIS — Z9049 Acquired absence of other specified parts of digestive tract: Secondary | ICD-10-CM | POA: Diagnosis not present

## 2024-04-11 DIAGNOSIS — R1310 Dysphagia, unspecified: Secondary | ICD-10-CM | POA: Diagnosis present

## 2024-04-11 DIAGNOSIS — Z6821 Body mass index (BMI) 21.0-21.9, adult: Secondary | ICD-10-CM | POA: Diagnosis not present

## 2024-04-11 DIAGNOSIS — S0181XA Laceration without foreign body of other part of head, initial encounter: Secondary | ICD-10-CM | POA: Diagnosis not present

## 2024-04-11 DIAGNOSIS — R54 Age-related physical debility: Secondary | ICD-10-CM | POA: Diagnosis present

## 2024-04-11 DIAGNOSIS — D098 Carcinoma in situ of other specified sites: Secondary | ICD-10-CM | POA: Diagnosis present

## 2024-04-11 DIAGNOSIS — R0989 Other specified symptoms and signs involving the circulatory and respiratory systems: Secondary | ICD-10-CM | POA: Diagnosis present

## 2024-04-11 DIAGNOSIS — F419 Anxiety disorder, unspecified: Secondary | ICD-10-CM | POA: Diagnosis not present

## 2024-04-11 DIAGNOSIS — M80059A Age-related osteoporosis with current pathological fracture, unspecified femur, initial encounter for fracture: Principal | ICD-10-CM | POA: Diagnosis present

## 2024-04-11 DIAGNOSIS — M80051A Age-related osteoporosis with current pathological fracture, right femur, initial encounter for fracture: Secondary | ICD-10-CM | POA: Diagnosis present

## 2024-04-11 DIAGNOSIS — D62 Acute posthemorrhagic anemia: Secondary | ICD-10-CM | POA: Diagnosis not present

## 2024-04-11 DIAGNOSIS — E43 Unspecified severe protein-calorie malnutrition: Secondary | ICD-10-CM | POA: Diagnosis present

## 2024-04-11 DIAGNOSIS — R64 Cachexia: Secondary | ICD-10-CM | POA: Diagnosis present

## 2024-04-11 DIAGNOSIS — R197 Diarrhea, unspecified: Secondary | ICD-10-CM | POA: Diagnosis present

## 2024-04-11 DIAGNOSIS — S01111A Laceration without foreign body of right eyelid and periocular area, initial encounter: Secondary | ICD-10-CM | POA: Diagnosis present

## 2024-04-11 DIAGNOSIS — Z1152 Encounter for screening for COVID-19: Secondary | ICD-10-CM | POA: Diagnosis not present

## 2024-04-11 DIAGNOSIS — Z515 Encounter for palliative care: Secondary | ICD-10-CM | POA: Diagnosis not present

## 2024-04-11 DIAGNOSIS — G4700 Insomnia, unspecified: Secondary | ICD-10-CM | POA: Diagnosis present

## 2024-04-11 DIAGNOSIS — F32A Depression, unspecified: Secondary | ICD-10-CM | POA: Insufficient documentation

## 2024-04-11 DIAGNOSIS — D72829 Elevated white blood cell count, unspecified: Secondary | ICD-10-CM | POA: Diagnosis present

## 2024-04-11 DIAGNOSIS — Z8249 Family history of ischemic heart disease and other diseases of the circulatory system: Secondary | ICD-10-CM | POA: Diagnosis not present

## 2024-04-11 DIAGNOSIS — S022XXA Fracture of nasal bones, initial encounter for closed fracture: Secondary | ICD-10-CM | POA: Diagnosis present

## 2024-04-11 DIAGNOSIS — I10 Essential (primary) hypertension: Secondary | ICD-10-CM | POA: Diagnosis present

## 2024-04-11 DIAGNOSIS — Z8262 Family history of osteoporosis: Secondary | ICD-10-CM | POA: Diagnosis not present

## 2024-04-11 DIAGNOSIS — Z79899 Other long term (current) drug therapy: Secondary | ICD-10-CM | POA: Diagnosis not present

## 2024-04-11 HISTORY — PX: INTRAMEDULLARY (IM) NAIL INTERTROCHANTERIC: SHX5875

## 2024-04-11 LAB — URINALYSIS, ROUTINE W REFLEX MICROSCOPIC
Bacteria, UA: NONE SEEN
Bilirubin Urine: NEGATIVE
Glucose, UA: NEGATIVE mg/dL
Ketones, ur: NEGATIVE mg/dL
Leukocytes,Ua: NEGATIVE
Nitrite: NEGATIVE
Protein, ur: 30 mg/dL — AB
Specific Gravity, Urine: 1.032 — ABNORMAL HIGH (ref 1.005–1.030)
pH: 7 (ref 5.0–8.0)

## 2024-04-11 LAB — RESP PANEL BY RT-PCR (RSV, FLU A&B, COVID)  RVPGX2
Influenza A by PCR: NEGATIVE
Influenza B by PCR: NEGATIVE
Resp Syncytial Virus by PCR: NEGATIVE
SARS Coronavirus 2 by RT PCR: NEGATIVE

## 2024-04-11 SURGERY — FIXATION, FRACTURE, INTERTROCHANTERIC, WITH INTRAMEDULLARY ROD
Anesthesia: General | Site: Hip | Laterality: Right

## 2024-04-11 MED ORDER — CEFAZOLIN SODIUM-DEXTROSE 2-3 GM-%(50ML) IV SOLR
INTRAVENOUS | Status: DC | PRN
  Administered 2024-04-11: 2 g via INTRAVENOUS

## 2024-04-11 MED ORDER — TRAMADOL HCL 50 MG PO TABS
50.0000 mg | ORAL_TABLET | Freq: Four times a day (QID) | ORAL | Status: DC | PRN
  Administered 2024-04-12 – 2024-04-14 (×3): 50 mg via ORAL
  Filled 2024-04-11 (×3): qty 1

## 2024-04-11 MED ORDER — ONDANSETRON HCL 4 MG/2ML IJ SOLN: 4.0000 mg | Freq: Four times a day (QID) | INTRAMUSCULAR | Status: DC | PRN

## 2024-04-11 MED ORDER — PANTOPRAZOLE SODIUM 40 MG PO TBEC
40.0000 mg | DELAYED_RELEASE_TABLET | Freq: Every day | ORAL | Status: DC
  Administered 2024-04-11 – 2024-04-15 (×5): 40 mg via ORAL
  Filled 2024-04-11 (×5): qty 1

## 2024-04-11 MED ORDER — ACETAMINOPHEN 325 MG PO TABS
650.0000 mg | ORAL_TABLET | Freq: Four times a day (QID) | ORAL | Status: DC | PRN
  Administered 2024-04-13: 650 mg via ORAL
  Filled 2024-04-11: qty 2

## 2024-04-11 MED ORDER — HYDROMORPHONE HCL 1 MG/ML IJ SOLN
0.5000 mg | INTRAMUSCULAR | Status: DC | PRN
  Administered 2024-04-11: 0.5 mg via INTRAVENOUS
  Filled 2024-04-11: qty 0.5

## 2024-04-11 MED ORDER — DOCUSATE SODIUM 100 MG PO CAPS
100.0000 mg | ORAL_CAPSULE | Freq: Two times a day (BID) | ORAL | Status: DC
  Administered 2024-04-11 – 2024-04-13 (×6): 100 mg via ORAL
  Filled 2024-04-11 (×9): qty 1

## 2024-04-11 MED ORDER — MELATONIN 5 MG PO TABS
5.0000 mg | ORAL_TABLET | Freq: Every evening | ORAL | Status: DC | PRN
Start: 2024-04-11 — End: 2024-04-15
  Administered 2024-04-11 – 2024-04-14 (×2): 5 mg via ORAL
  Filled 2024-04-11 (×2): qty 1

## 2024-04-11 MED ORDER — MENTHOL 3 MG MT LOZG: 1.0000 | LOZENGE | OROMUCOSAL | Status: DC | PRN

## 2024-04-11 MED ORDER — HYDRALAZINE HCL 20 MG/ML IJ SOLN: 5.0000 mg | Freq: Four times a day (QID) | INTRAMUSCULAR | Status: DC | PRN

## 2024-04-11 MED ORDER — EPHEDRINE SULFATE-NACL 50-0.9 MG/10ML-% IV SOSY
PREFILLED_SYRINGE | INTRAVENOUS | Status: DC | PRN
  Administered 2024-04-11: 5 mg via INTRAVENOUS

## 2024-04-11 MED ORDER — ESCITALOPRAM OXALATE 10 MG PO TABS
5.0000 mg | ORAL_TABLET | Freq: Every day | ORAL | Status: DC
  Administered 2024-04-11 – 2024-04-15 (×5): 5 mg via ORAL
  Filled 2024-04-11 (×5): qty 1

## 2024-04-11 MED ORDER — HYDROCODONE-ACETAMINOPHEN 5-325 MG PO TABS
1.0000 | ORAL_TABLET | ORAL | Status: DC | PRN
  Administered 2024-04-11: 2 via ORAL
  Administered 2024-04-12: 1 via ORAL
  Filled 2024-04-11: qty 1
  Filled 2024-04-11: qty 2

## 2024-04-11 MED ORDER — MORPHINE SULFATE (PF) 2 MG/ML IV SOLN: 2.0000 mg | INTRAVENOUS | Status: DC | PRN

## 2024-04-11 MED ORDER — PHENYLEPHRINE HCL-NACL 20-0.9 MG/250ML-% IV SOLN
INTRAVENOUS | Status: DC | PRN
  Administered 2024-04-11: 30 ug/min via INTRAVENOUS

## 2024-04-11 MED ORDER — TRANEXAMIC ACID-NACL 1000-0.7 MG/100ML-% IV SOLN
INTRAVENOUS | Status: AC
  Filled 2024-04-11: qty 100

## 2024-04-11 MED ORDER — HYDROCODONE-ACETAMINOPHEN 5-325 MG PO TABS: 1.0000 | ORAL_TABLET | Freq: Four times a day (QID) | ORAL | Status: DC | PRN

## 2024-04-11 MED ORDER — TRANEXAMIC ACID-NACL 1000-0.7 MG/100ML-% IV SOLN
1000.0000 mg | Freq: Once | INTRAVENOUS | Status: AC
  Administered 2024-04-11: 1000 mg via INTRAVENOUS

## 2024-04-11 MED ORDER — SUGAMMADEX SODIUM 200 MG/2ML IV SOLN
INTRAVENOUS | Status: DC | PRN
  Administered 2024-04-11: 200 mg via INTRAVENOUS

## 2024-04-11 MED ORDER — MORPHINE SULFATE (PF) 2 MG/ML IV SOLN: 0.5000 mg | INTRAVENOUS | Status: DC | PRN

## 2024-04-11 MED ORDER — TRANEXAMIC ACID 1000 MG/10ML IV SOLN
INTRAVENOUS | Status: DC | PRN
  Administered 2024-04-11: 1000 mg via INTRAVENOUS

## 2024-04-11 MED ORDER — PROPOFOL 10 MG/ML IV BOLUS
INTRAVENOUS | Status: DC | PRN
  Administered 2024-04-11: 80 mg via INTRAVENOUS

## 2024-04-11 MED ORDER — SODIUM CHLORIDE 0.9 % IV SOLN: INTRAVENOUS | Status: DC

## 2024-04-11 MED ORDER — ENOXAPARIN SODIUM 40 MG/0.4ML IJ SOSY
40.0000 mg | PREFILLED_SYRINGE | INTRAMUSCULAR | Status: DC
  Administered 2024-04-12 – 2024-04-15 (×4): 40 mg via SUBCUTANEOUS
  Filled 2024-04-11 (×4): qty 0.4

## 2024-04-11 MED ORDER — DEXAMETHASONE SOD PHOSPHATE PF 10 MG/ML IJ SOLN
INTRAMUSCULAR | Status: DC | PRN
  Administered 2024-04-11: 5 mg via INTRAVENOUS

## 2024-04-11 MED ORDER — 0.9 % SODIUM CHLORIDE (POUR BTL) OPTIME
TOPICAL | Status: DC | PRN
  Administered 2024-04-11: 1000 mL

## 2024-04-11 MED ORDER — FENTANYL CITRATE (PF) 100 MCG/2ML IJ SOLN
INTRAMUSCULAR | Status: DC | PRN
  Administered 2024-04-11 (×2): 50 ug via INTRAVENOUS

## 2024-04-11 MED ORDER — FENTANYL CITRATE (PF) 100 MCG/2ML IJ SOLN
INTRAMUSCULAR | Status: AC
  Filled 2024-04-11: qty 2

## 2024-04-11 MED ORDER — OXYCODONE HCL 5 MG PO TABS: 5.0000 mg | ORAL_TABLET | Freq: Once | ORAL | Status: DC | PRN

## 2024-04-11 MED ORDER — GLYCOPYRROLATE 0.2 MG/ML IJ SOLN
INTRAMUSCULAR | Status: DC | PRN
  Administered 2024-04-11: .2 mg via INTRAVENOUS

## 2024-04-11 MED ORDER — PHENYLEPHRINE HCL-NACL 20-0.9 MG/250ML-% IV SOLN
INTRAVENOUS | Status: AC
  Filled 2024-04-11: qty 250

## 2024-04-11 MED ORDER — TRAZODONE HCL 50 MG PO TABS
50.0000 mg | ORAL_TABLET | Freq: Every day | ORAL | Status: DC
  Administered 2024-04-11 – 2024-04-14 (×3): 50 mg via ORAL
  Filled 2024-04-11 (×4): qty 1

## 2024-04-11 MED ORDER — ALPRAZOLAM 0.25 MG PO TABS
0.2500 mg | ORAL_TABLET | Freq: Two times a day (BID) | ORAL | Status: DC | PRN
  Administered 2024-04-11 (×2): 0.25 mg via ORAL
  Filled 2024-04-11 (×2): qty 1

## 2024-04-11 MED ORDER — CEFAZOLIN SODIUM-DEXTROSE 2-4 GM/100ML-% IV SOLN
INTRAVENOUS | Status: AC
  Filled 2024-04-11: qty 100

## 2024-04-11 MED ORDER — PHENYLEPHRINE 80 MCG/ML (10ML) SYRINGE FOR IV PUSH (FOR BLOOD PRESSURE SUPPORT)
PREFILLED_SYRINGE | INTRAVENOUS | Status: DC | PRN
Start: 2024-04-11 — End: 2024-04-11
  Administered 2024-04-11: 80 ug via INTRAVENOUS

## 2024-04-11 MED ORDER — OXYCODONE HCL 5 MG/5ML PO SOLN: 5.0000 mg | Freq: Once | ORAL | Status: DC | PRN

## 2024-04-11 MED ORDER — HEPARIN SODIUM (PORCINE) 5000 UNIT/ML IJ SOLN: 5000.0000 [IU] | Freq: Three times a day (TID) | INTRAMUSCULAR | Status: DC

## 2024-04-11 MED ORDER — ONDANSETRON HCL 4 MG/2ML IJ SOLN
INTRAMUSCULAR | Status: DC | PRN
  Administered 2024-04-11: 4 mg via INTRAVENOUS

## 2024-04-11 MED ORDER — METOCLOPRAMIDE HCL 5 MG PO TABS: 5.0000 mg | ORAL_TABLET | Freq: Three times a day (TID) | ORAL | Status: DC | PRN

## 2024-04-11 MED ORDER — BUPIVACAINE-EPINEPHRINE (PF) 0.25% -1:200000 IJ SOLN
INTRAMUSCULAR | Status: DC | PRN
  Administered 2024-04-11: 20 mL via PERINEURAL

## 2024-04-11 MED ORDER — LIDOCAINE HCL (CARDIAC) PF 100 MG/5ML IV SOSY
PREFILLED_SYRINGE | INTRAVENOUS | Status: DC | PRN
  Administered 2024-04-11: 50 mg via INTRAVENOUS

## 2024-04-11 MED ORDER — SODIUM CHLORIDE 0.9 % IV SOLN: INTRAVENOUS | Status: DC | PRN

## 2024-04-11 MED ORDER — SENNOSIDES-DOCUSATE SODIUM 8.6-50 MG PO TABS: 1.0000 | ORAL_TABLET | Freq: Every evening | ORAL | Status: DC | PRN

## 2024-04-11 MED ORDER — PHENYLEPHRINE HCL-NACL 20-0.9 MG/250ML-% IV SOLN
INTRAVENOUS | Status: AC
Start: 2024-04-11 — End: 2024-04-11
  Filled 2024-04-11: qty 250

## 2024-04-11 MED ORDER — ACETAMINOPHEN 10 MG/ML IV SOLN
INTRAVENOUS | Status: DC | PRN
  Administered 2024-04-11: 1000 mg via INTRAVENOUS

## 2024-04-11 MED ORDER — ONDANSETRON HCL 4 MG PO TABS: 4.0000 mg | ORAL_TABLET | Freq: Four times a day (QID) | ORAL | Status: DC | PRN

## 2024-04-11 MED ORDER — METOCLOPRAMIDE HCL 5 MG/ML IJ SOLN: 5.0000 mg | Freq: Three times a day (TID) | INTRAMUSCULAR | Status: DC | PRN

## 2024-04-11 MED ORDER — FENTANYL CITRATE (PF) 100 MCG/2ML IJ SOLN: 25.0000 ug | INTRAMUSCULAR | Status: DC | PRN

## 2024-04-11 MED ORDER — PHENOL 1.4 % MT LIQD: 1.0000 | OROMUCOSAL | Status: DC | PRN

## 2024-04-11 MED ORDER — ACETAMINOPHEN 500 MG PO TABS
500.0000 mg | ORAL_TABLET | Freq: Four times a day (QID) | ORAL | Status: DC | PRN
Start: 2024-04-11 — End: 2024-04-11

## 2024-04-11 MED ORDER — ROCURONIUM BROMIDE 100 MG/10ML IV SOLN
INTRAVENOUS | Status: DC | PRN
  Administered 2024-04-11: 50 mg via INTRAVENOUS

## 2024-04-11 SURGICAL SUPPLY — 42 items
BIT DRILL CANN 16 HIP (BIT) IMPLANT
BIT DRILL CANN STP 6/9 HIP (BIT) IMPLANT
BIT DRILL LONG 4.2 (BIT) IMPLANT
BIT DRILL TAPERED 10 (BIT) IMPLANT
BLADE TFNA HELICAL 95 NON STRL (Anchor) IMPLANT
BNDG COHESIVE 6X5 TAN ST LF (GAUZE/BANDAGES/DRESSINGS) ×2 IMPLANT
CHLORAPREP W/TINT 26 (MISCELLANEOUS) ×1 IMPLANT
DERMABOND ADVANCED .7 DNX12 (GAUZE/BANDAGES/DRESSINGS) ×1 IMPLANT
DRAPE C-ARM XRAY 36X54 (DRAPES) ×1 IMPLANT
DRAPE C-ARMOR (DRAPES) IMPLANT
DRAPE SHEET LG 3/4 BI-LAMINATE (DRAPES) ×1 IMPLANT
DRSG OPSITE POSTOP 3X4 (GAUZE/BANDAGES/DRESSINGS) IMPLANT
DRSG OPSITE POSTOP 4X6 (GAUZE/BANDAGES/DRESSINGS) IMPLANT
ELECT CAUTERY BLADE 6.4 (BLADE) IMPLANT
ELECTRODE REM PT RTRN 9FT ADLT (ELECTROSURGICAL) IMPLANT
GLOVE PI ORTHO PRO STRL 7.5 (GLOVE) ×2 IMPLANT
GLOVE SURG SYN 7.5 PF PI (GLOVE) ×1 IMPLANT
GOWN SRG XL LONG LVL 3 NONREIN (GOWNS) ×1 IMPLANT
GOWN SRG XL LVL 3 NONREINFORCE (GOWNS) ×1 IMPLANT
GOWN STRL REUS W/ TWL LRG LVL3 (GOWN DISPOSABLE) ×1 IMPLANT
GUIDEWIRE 3.2X400 (WIRE) IMPLANT
HANDLE YANKAUER SUCT OPEN TIP (MISCELLANEOUS) ×1 IMPLANT
KIT PATIENT CARE HANA TABLE (KITS) ×1 IMPLANT
KIT TURNOVER CYSTO (KITS) ×1 IMPLANT
MANIFOLD NEPTUNE II (INSTRUMENTS) ×1 IMPLANT
MAT ABSORB FLUID 56X50 GRAY (MISCELLANEOUS) ×1 IMPLANT
NAIL CANN TFNA 9MM/130 DEG TI (Nail) IMPLANT
NDL HYPO 21X1.5 SAFETY (NEEDLE) ×1 IMPLANT
NEEDLE HYPO 21X1.5 SAFETY (NEEDLE) ×1 IMPLANT
NS IRRIG 500ML POUR BTL (IV SOLUTION) ×1 IMPLANT
PACK HIP COMPR (MISCELLANEOUS) ×1 IMPLANT
PAD ARMBOARD POSITIONER FOAM (MISCELLANEOUS) ×1 IMPLANT
PENCIL SMOKE EVACUATOR (MISCELLANEOUS) ×1 IMPLANT
SCREW LOCK STAR 5X36 (Screw) IMPLANT
SLEEVE SCD COMPRESS KNEE MED (STOCKING) ×1 IMPLANT
SOLN STERILE WATER BTL 1000 ML (IV SOLUTION) ×1 IMPLANT
SUT VIC AB 1 CT1 36 (SUTURE) ×1 IMPLANT
SUT VIC AB 2-0 CT2 27 (SUTURE) ×1 IMPLANT
SUTURE STRATA SPIR 4-0 18 (SUTURE) ×1 IMPLANT
SYR 20ML LL LF (SYRINGE) ×1 IMPLANT
TAPE MICROFOAM 4IN (TAPE) IMPLANT
TRAP FLUID SMOKE EVACUATOR (MISCELLANEOUS) IMPLANT

## 2024-04-11 NOTE — Progress Notes (Signed)
 PROGRESS NOTE  Claudia Friedman  FMW:[id number] DOB: 1930-10-09 DOA: 04/10/2024 PCP: Alla Amis, MD   [name] is a 89 year old female with history of depression with anxiety, GERD, insomnia, who presents ED via EMS for chief concerns of fall.  Serum sodium is 138, potassium 3.5, chloride 100, bicarb 23, BUN of 24, serum creatinine 1.09, eGFR 48, nonfasting glucose 112, WBC 23, hemoglobin 10.9, platelets of 197.  COVID/influenza A/influenza B/RSV PCR were negative.  Right hip x-ray: Acute right femoral intertrochanteric fracture, mildly comminuted and impacted.  11/9: Patient admitted to hospitalist service for right intertrochanteric hip fracture.  11/9: Patient taken to the OR on 11/9 for fixation, of intratrochanteric with intramedullary rod of the right lower extremity.  On my evaluation, vitals showed 97.6, respiratory rate 17, heart rate 53, blood pressure 91/59, SpO2 100% on nasal cannula.  Assessment & Plan:   Principal Problem:   Hip fracture due to osteoporosis [facility]) Active Problems:   Anxiety   GERD (gastroesophageal reflux disease)   Depression   Assessment and Plan:  * Hip fracture due to osteoporosis (HCC) Keep n.p.o. Patient will go to OR today with orthopedic service Symptomatic pain support: Hydrocodone-acetaminophen  5-325 mg every 6 hours as needed for moderate pain, 3 days ordered; morphine  2 mg IV every 4 hours as needed for severe pain, 1 day ordered  Anxiety Alprazolam  0.25 mg p.o. twice daily as needed for anxiety  Depression Escitalopram  5 mg daily resume  GERD (gastroesophageal reflux disease) Home PPI  DVT prophylaxis: Enoxaparin  Code Status: Full code Family Communication: Updated her son, Alm at bedside with patient's permission Disposition Plan: Pending clinical course, PT, OT Level of care: Med-Surg  Consultants:  Orthopedic service, PT, OT, TOC  Procedures:  Status post right intertrochanteric fixation with  intramedullary rod  Antimicrobials: None indicated at this time  Subjective:  At bedside, patient able to tell me her first and last name, age, location, current calendar year.  Her son Alm is at bedside.  She reports that she is feeling okay, much better regarding pain wise after her procedure.  She reports that she has not tried to move yet but she suspect it would hurt.  Objective: Vitals:   04/11/24 0049 04/11/24 0435 04/11/24 0752 04/11/24 0753  BP: (!) 113/58  (!) 91/59 (!) 91/59  Pulse: 73 (!) 53 (!) 53 (!) 53  Resp:   17 17  Temp: 98.4 F (36.9 C) 97.6 F (36.4 C) 97.6 F (36.4 C) 97.6 F (36.4 C)  TempSrc:  Oral    SpO2: 100% 98% 100% 100%  Weight:      Height:       Intake/Output Summary (Last 24 hours) at 04/11/2024 0837 Last data filed at 04/11/2024 0417 Gross per 24 hour  Intake 200 ml  Output --  Net 200 ml   Filed Weights   04/10/24 1902  Weight: 59.6 kg   Examination:  General exam: Appears calm and comfortable  Respiratory system: Clear to auscultation. Respiratory effort normal. Cardiovascular system: S1 & S2 heard, RRR. No JVD, murmurs, rubs, gallops or clicks. No pedal edema. Gastrointestinal system: Abdomen is nondistended, soft and nontender. No organomegaly or masses felt. Normal bowel sounds heard. Central nervous system: Alert and oriented. No focal neurological deficits. Extremities: Decreased range of motion in the right lower extremity.  No contractures Skin: No rashes, lesions or ulcers Psychiatry: Judgement and insight appear normal. Mood & affect appropriate.   Data Reviewed: I have personally reviewed following labs and imaging  studies  CBC: Recent Labs  Lab 04/10/24 2202  WBC 23.0*  HGB 10.9*  HCT 33.2*  MCV 96.0  PLT 197   Basic Metabolic Panel: Recent Labs  Lab 04/10/24 2202  NA 138  K 3.5  CL 100  CO2 23  GLUCOSE 112*  BUN 24*  CREATININE 1.09*  CALCIUM 8.2*   GFR: Estimated Creatinine Clearance: 31.5 mL/min  (A) (by C-G formula based on SCr of 1.09 mg/dL (H)).  Coagulation Profile: Recent Labs  Lab 04/10/24 2020  INR 1.1   Recent Results (from the past 240 hours)  Resp panel by RT-PCR (RSV, Flu A&B, Covid) Anterior Nasal Swab     Status: None   Collection Time: 04/10/24 11:16 PM   Specimen: Anterior Nasal Swab  Result Value Ref Range Status   SARS Coronavirus 2 by RT PCR NEGATIVE NEGATIVE Final    Comment: (NOTE) SARS-CoV-2 target nucleic acids are NOT DETECTED.  The SARS-CoV-2 RNA is generally detectable in upper respiratory specimens during the acute phase of infection. The lowest concentration of SARS-CoV-2 viral copies this assay can detect is 138 copies/mL. A negative result does not preclude SARS-Cov-2 infection and should not be used as the sole basis for treatment or other patient management decisions. A negative result may occur with  improper specimen collection/handling, submission of specimen other than nasopharyngeal swab, presence of viral mutation(s) within the areas targeted by this assay, and inadequate number of viral copies(<138 copies/mL). A negative result must be combined with clinical observations, patient history, and epidemiological information. The expected result is Negative.  Fact Sheet for Patients:  bloggercourse.com  Fact Sheet for Healthcare Providers:  seriousbroker.it  This test is no t yet approved or cleared by the [place]  FDA and  has been authorized for detection and/or diagnosis of SARS-CoV-2 by FDA under an Emergency Use Authorization (EUA). This EUA will remain  in effect (meaning this test can be used) for the duration of the COVID-19 declaration under Section 564(b)(1) of the Act, 21 U.S.C.section 360bbb-3(b)(1), unless the authorization is terminated  or revoked sooner.       Influenza A by PCR NEGATIVE NEGATIVE Final   Influenza B by PCR NEGATIVE NEGATIVE Final    Comment:  (NOTE) The Xpert Xpress SARS-CoV-2/FLU/RSV plus assay is intended as an aid in the diagnosis of influenza from Nasopharyngeal swab specimens and should not be used as a sole basis for treatment. Nasal washings and aspirates are unacceptable for Xpert Xpress SARS-CoV-2/FLU/RSV testing.  Fact Sheet for Patients: bloggercourse.com  Fact Sheet for Healthcare Providers: seriousbroker.it  This test is not yet approved or cleared by the [place]  FDA and has been authorized for detection and/or diagnosis of SARS-CoV-2 by FDA under an Emergency Use Authorization (EUA). This EUA will remain in effect (meaning this test can be used) for the duration of the COVID-19 declaration under Section 564(b)(1) of the Act, 21 U.S.C. section 360bbb-3(b)(1), unless the authorization is terminated or revoked.     Resp Syncytial Virus by PCR NEGATIVE NEGATIVE Final    Comment: (NOTE) Fact Sheet for Patients: bloggercourse.com  Fact Sheet for Healthcare Providers: seriousbroker.it  This test is not yet approved or cleared by the [place]  FDA and has been authorized for detection and/or diagnosis of SARS-CoV-2 by FDA under an Emergency Use Authorization (EUA). This EUA will remain in effect (meaning this test can be used) for the duration of the COVID-19 declaration under Section 564(b)(1) of the Act, 21 U.S.C. section 360bbb-3(b)(1), unless the authorization  is terminated or revoked.  Performed at [facility], [address] [place], [place] [address]     Radiology Studies: DG Chest Portable 1 View Result Date: 04/10/2024 EXAM: 1 VIEW(S) XRAY OF THE CHEST 04/10/2024 11:25:17 PM COMPARISON: 04/10/2024 CLINICAL HISTORY: Hip fracture, elevated white count FINDINGS: LUNGS AND PLEURA: No focal pulmonary opacity. No pulmonary edema. No pleural effusion. No pneumothorax. HEART AND  MEDIASTINUM: Tortuous aorta with atherosclerosis. No acute abnormality of the cardiac silhouette. BONES AND SOFT TISSUES: No acute osseous abnormality. IMPRESSION: 1. No acute cardiopulmonary process. Electronically signed by: Oneil Devonshire MD 04/10/2024 11:29 PM EST RP Workstation: [id number]   CT Cervical Spine Wo Contrast Result Date: 04/10/2024 EXAM: CT CERVICAL SPINE WITHOUT CONTRAST 04/10/2024 09:47:19 PM TECHNIQUE: CT of the cervical spine was performed without the administration of intravenous contrast. Multiplanar reformatted images are provided for review. Automated exposure control, iterative reconstruction, and/or weight based adjustment of the mA/kV was utilized to reduce the radiation dose to as low as reasonably achievable. COMPARISON: CT neck 04/30/2023. CLINICAL HISTORY: Facial trauma, blunt. FINDINGS: CERVICAL SPINE: BONES AND ALIGNMENT: Cervical lordosis is maintained. Similar appearance of anterolisthesis of C2 on C3. Similar appearance of anterolisthesis of C7 on T1 with slight asymmetric widening of the disc space which is unchanged from prior. No acute fracture or traumatic malalignment. DEGENERATIVE CHANGES: Degenerative endplate osteophytes and severe disc space narrowing at multiple levels which is similar to prior. Prominent partially visualized postsurgical changes in the left neck. Disc osteophyte complexes at multiple levels. There is mild spinal canal stenosis at C3-C4 through C5-C6. Facet arthrosis and uncovertebral hypertrophy at multiple levels. Significant foraminal stenosis at multiple levels throughout the cervical spine. SOFT TISSUES: No prevertebral soft tissue swelling. IMPRESSION: 1. No acute abnormality of the cervical spine related to the facial trauma. 2. Degenerative changes as above. 3. Alignment similar to prior. Electronically signed by: Donnice Mania MD 04/10/2024 10:11 PM EST RP Workstation: [id number]   CT Maxillofacial Wo Contrast Result Date: 04/10/2024 EXAM:  CT OF THE FACE WITHOUT CONTRAST 04/10/2024 09:47:19 PM TECHNIQUE: CT of the face was performed without the administration of intravenous contrast. Multiplanar reformatted images are provided for review. Automated exposure control, iterative reconstruction, and/or weight based adjustment of the mA/kV was utilized to reduce the radiation dose to as low as reasonably achievable. COMPARISON: None available. CLINICAL HISTORY: Facial trauma, blunt; note, history of left mandibular surgery. FINDINGS: FACIAL BONES: Postsurgical changes of partial resection of the left body of the mandible with osseous graft noted. There is plate and screw fixation of the left aspect of the mandible extending from the angle of the mandible into the paracentral region. The hardware appears intact. Subtle irregularity of the nasal bones which appears new since the 2022 head CT. There is slight lateral apex angulation of the left nasal bone concerning for minimally displaced fractures. The facial bones are otherwise intact. There is no mandibular condyle dislocation. No suspicious bone lesion. ORBITS: Globes are intact. Lenses are normally located. There is right preseptal soft tissue swelling with mild swelling of the eyelids. There is no evidence of postseptal swelling. No acute traumatic injury. No inflammatory change. SINUSES AND MASTOIDS: Mucosal thickening in the left maxillary sinus with thickening of the sinus wall suggestive of mucoperiosteal reaction. Findings are suggestive of chronic left maxillary sinusitis. There is focal chronic dehiscence in the floor of the left maxillary sinus without evidence of soft tissue communication between the oral cavity and the sinus. Small air fluid level in the right  maxillary sinus. Recommend correlation with tenderness over the sinus. Additional scattered mucosal thickening in the ethmoid sinuses. Trace mucosal thickening in the sphenoid sinuses. Concha bullosa of the bilateral middle turbinates.  No acute abnormality in the mastoids. SOFT TISSUES: Focal soft tissue swelling over the right zygomatic arch. Changes in the left submandibular space sequelae left sided neck dissection. There is a partially visualized 2.7 x 2.4 cm right submandibular/submental mass involving the subcutaneous tissues in the right anterior neck. This lesion is new compared to the CT neck on 04/30/23. Recommended correlation with nonemergent biopsy. No acute abnormality in other visualized soft tissues. IMPRESSION: 1. Subtle irregularity of the nasal bones with slight lateral apex angulation of the left nasal bone, concerning for minimally displaced fractures, new since the 2022 head CT. 2. Partially visualized 2.7 x 2.4 cm right submandibular/submental mass, new compared to the CT neck on 04/30/23. Recommend nonemergent biopsy. 3. Postsurgical changes of partial resection of the left body of the mandible with osseous graft and plate and screw fixation. 4. Chronic left maxillary sinusitis with focal chronic dehiscence in the floor of the left maxillary sinus without evidence of soft tissue communication between the oral cavity and the sinus. 5. Small air-fluid level in the right maxillary sinus. Electronically signed by: Donnice Mania MD 04/10/2024 10:04 PM EST RP Workstation: [id number]   CT Head Wo Contrast Result Date: 04/10/2024 EXAM: CT HEAD WITHOUT CONTRAST 04/10/2024 09:47:19 PM TECHNIQUE: CT of the head was performed without the administration of intravenous contrast. Automated exposure control, iterative reconstruction, and/or weight based adjustment of the mA/kV was utilized to reduce the radiation dose to as low as reasonably achievable. COMPARISON: CT head 10/07/2020. CLINICAL HISTORY: Facial trauma, blunt. FINDINGS: BRAIN AND VENTRICLES: No acute hemorrhage. No evidence of acute infarct. No hydrocephalus. No extra-axial collection. No mass effect or midline shift. Mild generalized parenchymal volume loss. Appearance of  mild to moderate chronic microvascular ischemic changes. Atherosclerosis of the carotid siphons. ORBITS: No acute abnormality. SINUSES: Mucosal thickening in the ethmoid and maxillary sinuses. SOFT TISSUES AND SKULL: No acute soft tissue abnormality. No skull fracture. IMPRESSION: 1. No acute intracranial abnormality related to the facial trauma. 2. Mild generalized parenchymal volume loss. 3. Mild to moderate chronic microvascular ischemic changes. Electronically signed by: Donnice Mania MD 04/10/2024 09:54 PM EST RP Workstation: [id number]   CT Hip Right Wo Contrast Result Date: 04/10/2024 EXAM: CT OF THE RIGHT HIP WITHOUT IV CONTRAST 04/10/2024 09:47:19 PM TECHNIQUE: CT of the right hip was performed without the administration of intravenous contrast. Multiplanar reformatted images are provided for review. Automated exposure control, iterative reconstruction, and/or weight based adjustment of the mA/kV was utilized to reduce the radiation dose to as low as reasonably achievable. COMPARISON: None available. CLINICAL HISTORY: Fracture, hip. FINDINGS: BONES: Intratrochanteric right hip fracture, mildly comminuted and impacted, with varus angulation/foreshortening. No aggressive appearing osseous abnormality or periostitis. SOFT TISSUE: Overlying lateral soft tissue hematoma measuring 4.4 x 9.9 cm. No soft tissue mass. JOINT: No significant degenerative changes. No osseous erosions. INTRAPELVIC CONTENTS: Limited images of the intrapelvic contents are unremarkable. IMPRESSION: 1. Intratrochanteric right hip fracture, as above. 2. Overlying lateral soft tissue hematoma. Electronically signed by: Pinkie Pebbles MD 04/10/2024 09:51 PM EST RP Workstation: [id number]   DG Chest 1 View Result Date: 04/10/2024 EXAM: 1 VIEW(S) XRAY OF THE CHEST 04/10/2024 08:17:43 PM COMPARISON: CT chest dated 04/30/2023. CLINICAL HISTORY: Fall. FINDINGS: LUNGS AND PLEURA: No focal pulmonary opacity. No pulmonary edema. No pleural  effusion. No pneumothorax. HEART AND  MEDIASTINUM: No acute abnormality of the cardiac and mediastinal silhouettes. BONES AND SOFT TISSUES: No acute osseous abnormality. IMPRESSION: 1. No acute cardiopulmonary process. Electronically signed by: Pinkie Pebbles MD 04/10/2024 08:24 PM EST RP Workstation: [id number]   DG Femur Min 2 Views Right Result Date: 04/10/2024 EXAM: 2 VIEW(S) XRAY OF THE RIGHT FEMUR 04/10/2024 08:17:43 PM COMPARISON: None available. CLINICAL HISTORY: fall, pain mid femur and R hip FINDINGS: BONES AND JOINTS: Acute intertrochanteric fracture of right hip with mild varus angulation. Degenerative changes in right knee. No focal osseous lesion. No joint dislocation. SOFT TISSUES: The soft tissues are unremarkable. IMPRESSION: 1. Acute intertrochanteric fracture of the right hip. Electronically signed by: Pinkie Pebbles MD 04/10/2024 08:23 PM EST RP Workstation: [id number]   DG Hip Unilat W or Wo Pelvis 2-3 Views Right Result Date: 04/10/2024 EXAM: 2 or 3 VIEW(S) XRAY OF THE RIGHT HIP 04/10/2024 08:17:43 PM COMPARISON: None available. CLINICAL HISTORY: fall fall FINDINGS: BONES AND JOINTS: The bones are diffusely osteopenic. There is an acute right femoral intertrochanteric fracture which appears mildly comminuted and impacted. Sclerotic focus in the left femoral head measuring 11 mm, possibly a bone island. There is no dislocation. The hip joint is maintained. No significant degenerative changes. SOFT TISSUES: The soft tissues are unremarkable. IMPRESSION: 1. Acute right femoral intertrochanteric fracture, mildly comminuted and impacted. Electronically signed by: Greig Pique MD 04/10/2024 08:22 PM EST RP Workstation: [id number]   Scheduled Meds:  escitalopram   5 mg Oral Daily   [START ON 04/12/2024] heparin  5,000 Units Subcutaneous Q8H   pantoprazole   40 mg Oral Daily   Continuous Infusions:  sodium chloride  40 mL/hr at 04/11/24 0133   acetaminophen  Stopped (04/11/24 0223)     LOS: 0 days   Time spent: 35 minutes  Dr. Sherre [facility] If 7PM-7AM, please contact night-coverage 04/11/2024, 8:37 AM

## 2024-04-11 NOTE — Anesthesia Postprocedure Evaluation (Signed)
 Anesthesia Post Note  Patient: Shaquita Rhee  Procedure(s) Performed: FIXATION, FRACTURE, INTERTROCHANTERIC, WITH INTRAMEDULLARY ROD (Right: Hip)  Patient location during evaluation: PACU Anesthesia Type: General Level of consciousness: awake and alert Pain management: pain level controlled Vital Signs Assessment: post-procedure vital signs reviewed and stable Respiratory status: spontaneous breathing, nonlabored ventilation, respiratory function stable and patient connected to nasal cannula oxygen Cardiovascular status: blood pressure returned to baseline and stable Postop Assessment: no apparent nausea or vomiting Anesthetic complications: no   No notable events documented.   Last Vitals:  Vitals:   04/11/24 1100 04/11/24 1115  BP: (!) 103/49 (!) 100/45  Pulse: (!) 57 (!) 57  Resp: 12 (!) 9  Temp:    SpO2: 99% 100%    Last Pain:  Vitals:   04/11/24 1115  TempSrc:   PainSc: 0-No pain                 Lendia LITTIE Mae

## 2024-04-11 NOTE — Assessment & Plan Note (Signed)
 Keep n.p.o. Patient will go to OR today with orthopedic service Symptomatic pain support: Hydrocodone-acetaminophen  5-325 mg every 6 hours as needed for moderate pain, 3 days ordered; morphine  2 mg IV every 4 hours as needed for severe pain, 1 day ordered

## 2024-04-11 NOTE — Plan of Care (Signed)

## 2024-04-11 NOTE — Anesthesia Preprocedure Evaluation (Addendum)
 Anesthesia Evaluation  Patient identified by MRN, date of birth, ID band Patient awake  General Assessment Comment:Hx of oropharyngeal cancer s/p resection & CRT   Reviewed: Allergy & Precautions, NPO status , Patient's Chart, lab work & pertinent test results  History of Anesthesia Complications Negative for: history of anesthetic complications  Airway Mallampati: III  TM Distance: >3 FB Neck ROM: full  Mouth opening: Limited Mouth Opening  Dental  (+) Chipped, Poor Dentition, Missing   Pulmonary neg pulmonary ROS   Pulmonary exam normal        Cardiovascular hypertension (recently having issues with hypotension), + dysrhythmias Atrial Fibrillation      Neuro/Psych  PSYCHIATRIC DISORDERS Anxiety     negative neurological ROS     GI/Hepatic negative GI ROS, Neg liver ROS,,,  Endo/Other  negative endocrine ROS    Renal/GU CRFRenal disease     Musculoskeletal   Abdominal   Peds  Hematology  (+) Blood dyscrasia, anemia   Anesthesia Other Findings Past Medical History: No date: Anxiety No date: History of echocardiogram     Comment:  a. 12/19/15: EF 60-65%, no RWMA, nl LV diastolic fxn,               mild AI, LA nl, PASP nl No date: History of nuclear stress test     Comment:  a. 12/19/15: no evidence of ischemia, EF 76%, low risk               study No date: Hypertension  Past Surgical History: No date: CHOLECYSTECTOMY 10/09/2020: COLONOSCOPY; N/A     Comment:  Procedure: COLONOSCOPY;  Surgeon: Maryruth Ole DASEN,               MD;  Location: [facility] ENDOSCOPY;  Service: Endoscopy;                Laterality: N/A; 10/09/2020: ESOPHAGOGASTRODUODENOSCOPY; N/A     Comment:  Procedure: ESOPHAGOGASTRODUODENOSCOPY (EGD);  Surgeon:               Maryruth Ole DASEN, MD;  Location: [facility] ENDOSCOPY;                Service: Endoscopy;  Laterality: N/A;  BMI    Body Mass Index: 21.21 kg/m       Reproductive/Obstetrics negative OB ROS                              Anesthesia Physical Anesthesia Plan  ASA: 3  Anesthesia Plan: General ETT   Post-op Pain Management: Ofirmev  IV (intra-op)* and Dilaudid IV   Induction: Intravenous  PONV Risk Score and Plan: 2  Airway Management Planned: Oral ETT  Additional Equipment:   Intra-op Plan:   Post-operative Plan: Extubation in OR  Informed Consent: I have reviewed the patients History and Physical, chart, labs and discussed the procedure including the risks, benefits and alternatives for the proposed anesthesia with the patient or authorized representative who has indicated his/her understanding and acceptance.     Dental Advisory Given  Plan Discussed with: Anesthesiologist, CRNA and Surgeon  Anesthesia Plan Comments: (Patient consented for risks of anesthesia including but not limited to:  - adverse reactions to medications - damage to eyes, teeth, lips or other oral mucosa - nerve damage due to positioning  - sore throat or hoarseness - Damage to heart, brain, nerves, lungs, other parts of body or loss of life  Patient voiced understanding and assent.)  Anesthesia Quick Evaluation

## 2024-04-11 NOTE — Hospital Course (Addendum)
 Ms. Quinisha Mould is a 89 year old female with history of depression with anxiety, GERD, insomnia, who presents ED via EMS for chief concerns of fall.  Serum sodium is 138, potassium 3.5, chloride 100, bicarb 23, BUN of 24, serum creatinine 1.09, eGFR 48, nonfasting glucose 112, WBC 23, hemoglobin 10.9, platelets of 197.  COVID/influenza A/influenza B/RSV PCR were negative.  Right hip x-ray: Acute right femoral intertrochanteric fracture, mildly comminuted and impacted.  11/9: Patient admitted to hospitalist service for right intertrochanteric hip fracture.  11/9: Patient taken to the OR on 11/9 for fixation, of intratrochanteric with intramedullary rod of the right lower extremity.  On my evaluation, vitals showed 97.6, respiratory rate 17, heart rate 53, blood pressure 91/59, SpO2 100% on nasal cannula.

## 2024-04-11 NOTE — Op Note (Signed)
 Patient Name: Claudia Friedman  FMW:[id number]  Pre-Operative Diagnosis: Right hip Intertrochanteric fracture  Post-Operative Diagnosis: (same)  Procedure: Right Hip Intramedullary nail   Components/Implants: Nail:TFNA 9x130 deg x123mm  Lag Blade :95mm Locking Screw:39mm  Date of Surgery: 04/11/2024  Surgeon: Arthea Sheer MD  Assistant: None  Anesthesiologist: Chesley  Anesthesia: General  EBL: 100cc  Complications: None   Brief history: The patient is a 89 year old female who presented to the [facility] emergency room after a fall and found to have a  right hip intertrochanteric fracture.  The patient was admitted by the medical team and optimized for surgery.  A thorough discussion was had with the patient and family about the risks and benefits of surgical intervention for their hip fracture as definitive treatment.  The patient and family opted to proceed with the operation.  All preoperative films were reviewed and an appropriate surgical plan was made prior to surgery.   Description of procedure: The patient was brought to the operating room where laterality was confirmed by all those present to be the right side.  The patient was administered anesthesia on a stretcher prior to being moved supine on the operating room table. Patient was given an intravenous dose of antibiotics for surgical prophylaxis and TXA.  All bony prominences and extremities were well padded and the patient was securely attached to the table boots, a perineal post was placed and the patient had a safety strap placed.  Surgical site was prepped with alcohol and chlorhexidine. The surgical site over the hip was and draped in typical sterile fashion with multiple layers of adhesive and nonadhesive drapes.  The incision site was marked out with a sterile marker under fluoroscopic guidance.    A surgical timeout was then called with participation of all staff in the room the patient was then  a confirmed again and laterality confirmed. The hip fracture was reduced through indirect measures with traction and rotation of the leg on the fracture table. After an acceptable reduction was obtained on AP and lateral images the procedure started.  An incision was made just proximal to the greater trochanter through the skin subcutaneous tissues and an incision was made in the glut max fascia.  A guidewire was placed through the greater trochanter at the tip under x-ray guidance into the intertrochanteric region.  The position of this wire was assessed on AP and lateral fluoroscopic images to ensure position.  An opening reamer was used to create access at the tip of the greater trochanter under fluoroscopic guidance.   A size 9x170 nail was advanced through the reamed hole in the proximal femur and seated within the femur to an appropriate depth under fluoroscopic guidance.  The aiming jig was used to mark an incision site for a lag blade to the femoral head which was then incised with a scalpel.  A wire was then advanced through the lateral cortex of the femur into the center of the femoral head on both AP and lateral fluoroscopic imaging stopping at the subchondral bone without penetration of the femoral head.  This wire was then used to measure for a lag screw and a size 95 lag blade was inserted into the femoral head through the lateral cortex of the femur and the nail under fluoroscopic guidance.  The setscrew was then tightened in the proximal part of the nail and engaged with the lag blade with a small amount of play left so as to allow compression of the fracture.  The lag screw driver was removed and the aiming jig was switched for the distal interlocking screw.  A drill was used to drill a hole for the distal interlocking screw under fluoroscopic guidance and a size 36 screw was placed.  The intramedullary nail was assessed on AP and lateral fluoroscopic guidance prior to removal of the aiming  jig.  Final fluoroscopic x-rays were then taken after removal of the jig.  The nail was found to be in appropriate position on AP and lateral imaging with appropriate lengths of both the lag screw in the distal locking screw.  The fascia was closed with 0 Vicryl interrupted figure-of-eight sutures.  The subcutaneous tissues were closed with 2-0 Vicryl and the skin closed with 4-0 stratafix and Dermabond.  Sterile dressings were applied to the incisions.   The patient was awoken from anesthesia transferred off of the operating room table onto a hospital bed.  The patient had a good pulse postoperatively in the foot . the patient was then transferred to the PACU in stable condition.

## 2024-04-11 NOTE — Consult Note (Signed)
 ORTHOPAEDIC CONSULTATION  REQUESTING PHYSICIAN: Cox, Amy N, DO  Chief Complaint:   Right intertrochanteric fracture  History of Present Illness: Claudia Friedman is a 89 y.o. 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  She presented to the emergency room yesterday after having mechanical fall at ground-level hitting her head on the right side and landing on her right hip.  She had severe right hip pain and was brought to the emergency room for evaluation.  She denies loss of consciousness.  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  She is alert and oriented and reports she lives alone.  I did call and discussed the case with the patient's son.  Patient denies any pre-existing right hip pain prior to the fall.  Denies any numbness and tingling in the leg at this time.  Past Medical History:  Diagnosis Date   Anxiety    History of echocardiogram    a. 12/19/15: EF 60-65%, no RWMA, nl LV diastolic fxn, mild AI, LA nl, PASP nl   History of nuclear stress test    a. 12/19/15: no evidence of ischemia, EF 76%, low risk study   Hypertension    Past Surgical History:  Procedure Laterality Date   CHOLECYSTECTOMY     COLONOSCOPY N/A 10/09/2020   Procedure: COLONOSCOPY;  Surgeon: Maryruth Ole DASEN, MD;  Location: [facility] ENDOSCOPY;  Service: Endoscopy;  Laterality: N/A;   ESOPHAGOGASTRODUODENOSCOPY N/A 10/09/2020   Procedure: ESOPHAGOGASTRODUODENOSCOPY (EGD);  Surgeon: Maryruth Ole DASEN, MD;  Location: [facility] ENDOSCOPY;  Service: Endoscopy;  Laterality: N/A;   Social History   Socioeconomic History   Marital status: Divorced    Spouse name: Not on file   Number of children: Not on file   Years of education: Not on file   Highest education level:  Not on file  Occupational History   Not on file  Tobacco Use   Smoking status: Never   Smokeless tobacco: Never  Substance and Sexual Activity   Alcohol use: Yes    Comment: occasional    Drug use: No   Sexual activity: Not Currently  Other Topics Concern   Not on file  Social History Narrative   Not on file   Social Drivers of Health   Financial Resource Strain: Low Risk  (04/08/2023)   Received from [facility]   Overall Financial Resource Strain (CARDIA)    Difficulty of Paying Living Expenses: Not hard at all  Food Insecurity: No Food Insecurity (04/11/2024)   Hunger Vital Sign    Worried About Running Out of Food in the Last Year: Never true    Ran Out of Food in the Last Year: Never true  Transportation Needs: No Transportation Needs (04/11/2024)   PRAPARE - Administrator, Civil Service (Medical): No    Lack of Transportation (Non-Medical): No  Physical Activity: Not on file  Stress: Not on file  Social Connections: Moderately Isolated (04/11/2024)   Social Connection and Isolation Panel    Frequency of Communication with Friends and Family: Twice a week    Frequency of Social Gatherings with Friends and Family: Twice a week    Attends Religious Services: 1 to 4 times per year    Active Member of [employer] or Organizations: No    Attends Banker Meetings: Never    Marital Status: Widowed  Family History  Problem Relation Age of Onset   CAD Other    Osteoporosis Other    No Known Allergies Prior to Admission medications   Medication Sig Start Date End Date Taking? Authorizing Provider  diphenoxylate-atropine (LOMOTIL) 2.5-0.025 MG tablet Take 1 tablet by mouth 4 (four) times daily. 04/02/24 04/02/25 Yes [provider]  escitalopram  (LEXAPRO ) 10 MG tablet Take 10 mg by mouth daily. 08/11/23  Yes [provider]  hydrOXYzine (ATARAX) 10 MG tablet Take 10 mg by mouth 3 (three) times daily.   Yes [provider]  traZODone  (DESYREL ) 50 MG tablet Take 50 mg by mouth at bedtime. 07/11/23  Yes [provider]  acetaminophen  (TYLENOL ) 500 MG tablet Take 500 mg by mouth every 6 (six) hours as needed.    [provider]  ALPRAZolam  (XANAX ) 0.25 MG tablet Take 1 tablet (0.25 mg total) by mouth 2 (two) times daily as needed for anxiety. 10/11/20   Samtani, Jai-Gurmukh, MD  escitalopram  (LEXAPRO ) 5 MG tablet Take 5 mg by mouth daily. 07/20/20   [provider]  ferrous sulfate  325 (65 FE) MG EC tablet Take 1 tablet (325 mg total) by mouth 2 (two) times daily. 10/11/20 10/11/21  Samtani, Jai-Gurmukh, MD  pantoprazole  (PROTONIX ) 40 MG tablet Take 1 tablet (40 mg total) by mouth daily. 10/12/20   Samtani, Jai-Gurmukh, MD   DG Chest Portable 1 View Result Date: 04/10/2024 EXAM: 1 VIEW(S) XRAY OF THE CHEST 04/10/2024 11:25:17 PM COMPARISON: 04/10/2024 CLINICAL HISTORY: Hip fracture, elevated white count FINDINGS: LUNGS AND PLEURA: No focal pulmonary opacity. No pulmonary edema. No pleural effusion. No pneumothorax. HEART AND MEDIASTINUM: Tortuous aorta with atherosclerosis. No acute abnormality of the cardiac silhouette. BONES AND SOFT TISSUES: No acute osseous abnormality. IMPRESSION: 1. No acute cardiopulmonary process. Electronically signed by: Oneil Devonshire MD 04/10/2024 11:29 PM EST RP Workstation: [id number]   CT Cervical Spine Wo Contrast Result Date: 04/10/2024 EXAM: CT CERVICAL SPINE WITHOUT CONTRAST 04/10/2024 09:47:19 PM TECHNIQUE: CT of the cervical spine was performed without the administration of intravenous contrast. Multiplanar reformatted images are provided for review. Automated exposure control, iterative reconstruction, and/or weight based adjustment of the mA/kV was utilized to reduce the radiation dose to as low as reasonably achievable. COMPARISON: CT neck 04/30/2023. CLINICAL HISTORY: Facial trauma, blunt. FINDINGS: CERVICAL SPINE: BONES AND ALIGNMENT: Cervical lordosis  is maintained. Similar appearance of anterolisthesis of C2 on C3. Similar appearance of anterolisthesis of C7 on T1 with slight asymmetric widening of the disc space which is unchanged from prior. No acute fracture or traumatic malalignment. DEGENERATIVE CHANGES: Degenerative endplate osteophytes and severe disc space narrowing at multiple levels which is similar to prior. Prominent partially visualized postsurgical changes in the left neck. Disc osteophyte complexes at multiple levels. There is mild spinal canal stenosis at C3-C4 through C5-C6. Facet arthrosis and uncovertebral hypertrophy at multiple levels. Significant foraminal stenosis at multiple levels throughout the cervical spine. SOFT TISSUES: No prevertebral soft tissue swelling. IMPRESSION: 1. No acute abnormality of the cervical spine related to the facial trauma. 2. Degenerative changes as above. 3. Alignment similar to prior. Electronically signed by: Donnice Mania MD 04/10/2024 10:11 PM EST RP Workstation: [id number]   CT Maxillofacial Wo Contrast Result Date: 04/10/2024 EXAM: CT OF THE FACE WITHOUT CONTRAST 04/10/2024 09:47:19 PM TECHNIQUE: CT of the face was performed without the administration of intravenous contrast. Multiplanar reformatted images are provided for review. Automated exposure control, iterative reconstruction, and/or weight based adjustment of the mA/kV was utilized to  reduce the radiation dose to as low as reasonably achievable. COMPARISON: None available. CLINICAL HISTORY: Facial trauma, blunt; note, history of left mandibular surgery. FINDINGS: FACIAL BONES: Postsurgical changes of partial resection of the left body of the mandible with osseous graft noted. There is plate and screw fixation of the left aspect of the mandible extending from the angle of the mandible into the paracentral region. The hardware appears intact. Subtle irregularity of the nasal bones which appears new since the 2022 head CT. There is slight lateral  apex angulation of the left nasal bone concerning for minimally displaced fractures. The facial bones are otherwise intact. There is no mandibular condyle dislocation. No suspicious bone lesion. ORBITS: Globes are intact. Lenses are normally located. There is right preseptal soft tissue swelling with mild swelling of the eyelids. There is no evidence of postseptal swelling. No acute traumatic injury. No inflammatory change. SINUSES AND MASTOIDS: Mucosal thickening in the left maxillary sinus with thickening of the sinus wall suggestive of mucoperiosteal reaction. Findings are suggestive of chronic left maxillary sinusitis. There is focal chronic dehiscence in the floor of the left maxillary sinus without evidence of soft tissue communication between the oral cavity and the sinus. Small air fluid level in the right maxillary sinus. Recommend correlation with tenderness over the sinus. Additional scattered mucosal thickening in the ethmoid sinuses. Trace mucosal thickening in the sphenoid sinuses. Concha bullosa of the bilateral middle turbinates. No acute abnormality in the mastoids. SOFT TISSUES: Focal soft tissue swelling over the right zygomatic arch. Changes in the left submandibular space sequelae left sided neck dissection. There is a partially visualized 2.7 x 2.4 cm right submandibular/submental mass involving the subcutaneous tissues in the right anterior neck. This lesion is new compared to the CT neck on 04/30/23. Recommended correlation with nonemergent biopsy. No acute abnormality in other visualized soft tissues. IMPRESSION: 1. Subtle irregularity of the nasal bones with slight lateral apex angulation of the left nasal bone, concerning for minimally displaced fractures, new since the 2022 head CT. 2. Partially visualized 2.7 x 2.4 cm right submandibular/submental mass, new compared to the CT neck on 04/30/23. Recommend nonemergent biopsy. 3. Postsurgical changes of partial resection of the left body of  the mandible with osseous graft and plate and screw fixation. 4. Chronic left maxillary sinusitis with focal chronic dehiscence in the floor of the left maxillary sinus without evidence of soft tissue communication between the oral cavity and the sinus. 5. Small air-fluid level in the right maxillary sinus. Electronically signed by: Donnice Mania MD 04/10/2024 10:04 PM EST RP Workstation: [id number]   CT Head Wo Contrast Result Date: 04/10/2024 EXAM: CT HEAD WITHOUT CONTRAST 04/10/2024 09:47:19 PM TECHNIQUE: CT of the head was performed without the administration of intravenous contrast. Automated exposure control, iterative reconstruction, and/or weight based adjustment of the mA/kV was utilized to reduce the radiation dose to as low as reasonably achievable. COMPARISON: CT head 10/07/2020. CLINICAL HISTORY: Facial trauma, blunt. FINDINGS: BRAIN AND VENTRICLES: No acute hemorrhage. No evidence of acute infarct. No hydrocephalus. No extra-axial collection. No mass effect or midline shift. Mild generalized parenchymal volume loss. Appearance of mild to moderate chronic microvascular ischemic changes. Atherosclerosis of the carotid siphons. ORBITS: No acute abnormality. SINUSES: Mucosal thickening in the ethmoid and maxillary sinuses. SOFT TISSUES AND SKULL: No acute soft tissue abnormality. No skull fracture. IMPRESSION: 1. No acute intracranial abnormality related to the facial trauma. 2. Mild generalized parenchymal volume loss. 3. Mild to moderate chronic microvascular ischemic changes. Electronically signed  by: Donnice Mania MD 04/10/2024 09:54 PM EST RP Workstation: [id number]   CT Hip Right Wo Contrast Result Date: 04/10/2024 EXAM: CT OF THE RIGHT HIP WITHOUT IV CONTRAST 04/10/2024 09:47:19 PM TECHNIQUE: CT of the right hip was performed without the administration of intravenous contrast. Multiplanar reformatted images are provided for review. Automated exposure control, iterative reconstruction, and/or  weight based adjustment of the mA/kV was utilized to reduce the radiation dose to as low as reasonably achievable. COMPARISON: None available. CLINICAL HISTORY: Fracture, hip. FINDINGS: BONES: Intratrochanteric right hip fracture, mildly comminuted and impacted, with varus angulation/foreshortening. No aggressive appearing osseous abnormality or periostitis. SOFT TISSUE: Overlying lateral soft tissue hematoma measuring 4.4 x 9.9 cm. No soft tissue mass. JOINT: No significant degenerative changes. No osseous erosions. INTRAPELVIC CONTENTS: Limited images of the intrapelvic contents are unremarkable. IMPRESSION: 1. Intratrochanteric right hip fracture, as above. 2. Overlying lateral soft tissue hematoma. Electronically signed by: Pinkie Pebbles MD 04/10/2024 09:51 PM EST RP Workstation: [id number]   DG Chest 1 View Result Date: 04/10/2024 EXAM: 1 VIEW(S) XRAY OF THE CHEST 04/10/2024 08:17:43 PM COMPARISON: CT chest dated 04/30/2023. CLINICAL HISTORY: Fall. FINDINGS: LUNGS AND PLEURA: No focal pulmonary opacity. No pulmonary edema. No pleural effusion. No pneumothorax. HEART AND MEDIASTINUM: No acute abnormality of the cardiac and mediastinal silhouettes. BONES AND SOFT TISSUES: No acute osseous abnormality. IMPRESSION: 1. No acute cardiopulmonary process. Electronically signed by: Pinkie Pebbles MD 04/10/2024 08:24 PM EST RP Workstation: [id number]   DG Femur Min 2 Views Right Result Date: 04/10/2024 EXAM: 2 VIEW(S) XRAY OF THE RIGHT FEMUR 04/10/2024 08:17:43 PM COMPARISON: None available. CLINICAL HISTORY: fall, pain mid femur and R hip FINDINGS: BONES AND JOINTS: Acute intertrochanteric fracture of right hip with mild varus angulation. Degenerative changes in right knee. No focal osseous lesion. No joint dislocation. SOFT TISSUES: The soft tissues are unremarkable. IMPRESSION: 1. Acute intertrochanteric fracture of the right hip. Electronically signed by: Pinkie Pebbles MD 04/10/2024 08:23 PM EST RP  Workstation: [id number]   DG Hip Unilat W or Wo Pelvis 2-3 Views Right Result Date: 04/10/2024 EXAM: 2 or 3 VIEW(S) XRAY OF THE RIGHT HIP 04/10/2024 08:17:43 PM COMPARISON: None available. CLINICAL HISTORY: fall fall FINDINGS: BONES AND JOINTS: The bones are diffusely osteopenic. There is an acute right femoral intertrochanteric fracture which appears mildly comminuted and impacted. Sclerotic focus in the left femoral head measuring 11 mm, possibly a bone island. There is no dislocation. The hip joint is maintained. No significant degenerative changes. SOFT TISSUES: The soft tissues are unremarkable. IMPRESSION: 1. Acute right femoral intertrochanteric fracture, mildly comminuted and impacted. Electronically signed by: Greig Pique MD 04/10/2024 08:22 PM EST RP Workstation: [id number]    Positive ROS: All other systems have been reviewed and were otherwise negative with the exception of those mentioned in the HPI and as above.  Physical Exam: General:  Alert, no acute distress Psychiatric:  Patient is competent for consent with normal mood and affect   Cardiovascular:  No pedal edema Respiratory:  No wheezing, non-labored breathing GI:  Abdomen is soft and non-tender Skin:  No lesions in the area of chief complaint Neurologic:  Sensation intact distally Lymphatic:  No axillary or cervical lymphadenopathy  Orthopedic Exam:  Right lower extremity shortened externally rotated Skin intact over the hip tender to palpation over the hip No tenderness of the distal femur, knee, tibia, ankle foot or toes Compartments all soft Intact dorsalis pedis pulse able to dorsiflex and plantarflex the foot and toes Pain with any motion of  the leg or logroll localized to the groin  Secondary survey No tenderness to palpation over other bony prominences in the lower extremities or bilateral upper extremities No pain with logroll or simulated axial loading of the left lower extremity All compartments soft No  tenderness to palpation over the cervical or thoracic spine, no bony step-off Motor grossly intact throughout, no focal deficits Sensation grossly intact throughout, no focal deficits Good distal pulses and capillary refill on all extremities   X-rays:  X-rays and CT scan reviewed which show a basicervical intertrochanteric fracture of the right hip.  With good remaining bone stock on the femoral neck.  Agree with radiologist interpretation  Assessment: Right basicervical intertrochanteric fracture  Plan: Claudia Friedman is a 89 year old female presents with a right intertrochanteric fracture.  I discussed with the patient and her son over the phone the risks and benefits of surgical versus nonsurgical intervention for this fracture and under shared decision making model the patient and son agreed the plan move forward surgical intervention for her right hip.  The patient will need a open reduction internal fixation with intramedullary nail.  A long discussion took place with the patient describing what a intramedullary nail is and what the procedure would entail. The xrays were reviewed with the patient and the implants were discussed. The ability to secure the implant utilizing screw and blade fixation was discussed. Surgical exposures were discussed with the patient.    The hospitalization and post-operative care and rehabilitation were also discussed. The use of perioperative antibiotics and DVT prophylaxis were discussed. The risk, benefits and alternatives to a surgical intervention were discussed at length with the patient. The patient was also advised of risks related to the medical comorbidities. A lengthy discussion took place to review the most common complications including but not limited to: deep vein thrombosis, pulmonary embolus, heart attack, stroke, infection, wound breakdown, dislocation, numbness, leg length in-equality, damage to nerves, intraoperative fracture, hardware cut out or  failure, malunion/ nonunion, tendon,muscles, arteries or other blood vessels, death and other possible complications from anesthesia. The patient was told that we will take steps to minimize these risks by using sterile technique, antibiotics and DVT prophylaxis when appropriate and follow the patient postoperatively in the office setting to monitor progress. The possibility of recurrent pain, no improvement in pain and actual worsening of pain were also discussed with the patient.      The benefits of surgery were discussed with the patient and son including the potential for improving the patient's current clinical condition through operative intervention. Alternatives to surgical intervention including conservative management were also discussed in detail. All questions were answered to the satisfaction of the patient. The patient participated and agreed to the plan of care as well as the use of the recommended implants for their surgery.    Plan for surgery today 04/11/2024 N.p.o. for the operating room Hold anticoagulation     Debria Broecker MD  Beeper #:  [phone]  04/11/2024 8:50 AM

## 2024-04-11 NOTE — Assessment & Plan Note (Signed)
 Escitalopram  5 mg daily resume

## 2024-04-11 NOTE — Anesthesia Procedure Notes (Signed)
 Procedure Name: Intubation Date/Time: 04/11/2024 8:58 AM  Performed by: Rosine Shona Jansky, CRNAPre-anesthesia Checklist: Patient identified, Emergency Drugs available, Suction available and Patient being monitored Patient Re-evaluated:Patient Re-evaluated prior to induction Oxygen Delivery Method: Circle system utilized Preoxygenation: Pre-oxygenation with 100% oxygen Induction Type: IV induction Ventilation: Mask ventilation without difficulty Laryngoscope Size: McGrath and 3 Grade View: Grade I Tube type: Oral Tube size: 6.5 mm Number of attempts: 1 Airway Equipment and Method: Stylet and Oral airway Placement Confirmation: ETT inserted through vocal cords under direct vision, positive ETCO2 and breath sounds checked- equal and bilateral Secured at: 21 cm Tube secured with: Tape Dental Injury: Teeth and Oropharynx as per pre-operative assessment

## 2024-04-11 NOTE — ED Provider Notes (Signed)
 [facility] Provider Note   Event Date/Time   First MD Initiated Contact with Patient 04/10/24 1907     (approximate)  History   Fall  HPI  Claudia Friedman is a 89 y.o. 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  She was walking across the room when she stumbled and fell.  She fell onto her face and right side.  She had immediate severe pain in her right hip.  She also was noted to have a small cut over the right eye.  She did not pass out.  She was feeling normal and not sick or ill before this happened.   Past Medical History:  Diagnosis Date   Anxiety    History of echocardiogram    a. 12/19/15: EF 60-65%, no RWMA, nl LV diastolic fxn, mild AI, LA nl, PASP nl   History of nuclear stress test    a. 12/19/15: no evidence of ischemia, EF 76%, low risk study   Hypertension    She has been following with [facility] closely for cancer, had surgery in the bed of her mouth   No chest pain or shortness of breath.  No preceding symptoms  Physical Exam   Triage Vital Signs: ED Triage Vitals  Encounter Vitals Group     BP 04/10/24 1901 (!) 143/61     Girls Systolic BP Percentile --      Girls Diastolic BP Percentile --      Boys Systolic BP Percentile --      Boys Diastolic BP Percentile --      Pulse Rate 04/10/24 1901 82     Resp 04/10/24 1901 16     Temp 04/10/24 2304 98 F (36.7 C)     Temp Source 04/10/24 2304 Oral     SpO2 04/10/24 1901 98 %     Weight 04/10/24 1902 131 lb 6.4 oz (59.6 kg)     Height 04/10/24 1902 5' 6 (1.676 m)     Head Circumference --      Peak Flow --      Pain Score 04/10/24 1901 6     Pain Loc --      Pain Education --      Exclude from Growth Chart --     Most recent vital signs: Vitals:   04/11/24 0000 04/11/24 0049  BP: (!)  105/55 (!) 113/58  Pulse: 62 73  Resp: 15   Temp:  98.4 F (36.9 C)  SpO2: 100% 100%     General: Awake, moderate pain isolated to the right hip only.  Normocephalic atraumatic extraocular movements normal no conjunctival injury no proptosis.  She does with exception have a small laceration about 1-1/2 cm in length and quite superficial just lateral to the right orbit.  It is clean there is no foreign body noted within it.  I cleansed and irrigated this with normal saline and addressed wound care after patient verbally consented to closure CV:  Good peripheral perfusion.  Normal tones and rate Resp:  Normal effort.  Clear bilateral Abd:  No distention.  Soft nontender nondistended Other:  Moves upper extremities bilaterally well warm well-perfused left lower extremity demonstrates normal use.  No obvious injuries to the extremities with exception to the right leg where  she isolates significant pain to the right trochanteric region, no tenderness from the right knee down wiggles toes well normal sensation right foot strong palpable pulses in the right foot.  The right leg is slightly shortened and minimally externally wrote   ED Results / Procedures / Treatments   Labs (all labs ordered are listed, but only abnormal results are displayed) Labs Reviewed  CBC - Abnormal; Notable for the following components:      Result Value   WBC 23.0 (*)    RBC 3.46 (*)    Hemoglobin 10.9 (*)    HCT 33.2 (*)    All other components within normal limits  BASIC METABOLIC PANEL WITH GFR - Abnormal; Notable for the following components:   Glucose, Bld 112 (*)    BUN 24 (*)    Creatinine, Ser 1.09 (*)    Calcium 8.2 (*)    GFR, Estimated 48 (*)    All other components within normal limits  RESP PANEL BY RT-PCR (RSV, FLU A&B, COVID)  RVPGX2  PROTIME-INR  URINALYSIS, ROUTINE W REFLEX MICROSCOPIC   Labs notable for significant leukocytosis mild anemia.  White count of 23,000 appears to be an acute  change from her baseline.  No obvious infectious symptoms not febrile.  She does not have any clear symptoms suggest acute infection but this could be reactive due to pain and injury though infection is certainly something that we need to exclude.  Urinalysis attempted but bladder was empty on In-N-Out catheterization, PureWick placed to attempt.  Patient denies however any obvious infectious symptoms   RADIOLOGY  DG Chest Portable 1 View Result Date: 04/10/2024 EXAM: 1 VIEW(S) XRAY OF THE CHEST 04/10/2024 11:25:17 PM COMPARISON: 04/10/2024 CLINICAL HISTORY: Hip fracture, elevated white count FINDINGS: LUNGS AND PLEURA: No focal pulmonary opacity. No pulmonary edema. No pleural effusion. No pneumothorax. HEART AND MEDIASTINUM: Tortuous aorta with atherosclerosis. No acute abnormality of the cardiac silhouette. BONES AND SOFT TISSUES: No acute osseous abnormality. IMPRESSION: 1. No acute cardiopulmonary process. Electronically signed by: Oneil Devonshire MD 04/10/2024 11:29 PM EST RP Workstation: [id number]   CT Cervical Spine Wo Contrast Result Date: 04/10/2024 EXAM: CT CERVICAL SPINE WITHOUT CONTRAST 04/10/2024 09:47:19 PM TECHNIQUE: CT of the cervical spine was performed without the administration of intravenous contrast. Multiplanar reformatted images are provided for review. Automated exposure control, iterative reconstruction, and/or weight based adjustment of the mA/kV was utilized to reduce the radiation dose to as low as reasonably achievable. COMPARISON: CT neck 04/30/2023. CLINICAL HISTORY: Facial trauma, blunt. FINDINGS: CERVICAL SPINE: BONES AND ALIGNMENT: Cervical lordosis is maintained. Similar appearance of anterolisthesis of C2 on C3. Similar appearance of anterolisthesis of C7 on T1 with slight asymmetric widening of the disc space which is unchanged from prior. No acute fracture or traumatic malalignment. DEGENERATIVE CHANGES: Degenerative endplate osteophytes and severe disc space narrowing at  multiple levels which is similar to prior. Prominent partially visualized postsurgical changes in the left neck. Disc osteophyte complexes at multiple levels. There is mild spinal canal stenosis at C3-C4 through C5-C6. Facet arthrosis and uncovertebral hypertrophy at multiple levels. Significant foraminal stenosis at multiple levels throughout the cervical spine. SOFT TISSUES: No prevertebral soft tissue swelling. IMPRESSION: 1. No acute abnormality of the cervical spine related to the facial trauma. 2. Degenerative changes as above. 3. Alignment similar to prior. Electronically signed by: Donnice Mania MD 04/10/2024 10:11 PM EST RP Workstation: [id number]   CT Maxillofacial Wo Contrast Result Date: 04/10/2024 EXAM: CT OF THE FACE WITHOUT CONTRAST 04/10/2024  09:47:19 PM TECHNIQUE: CT of the face was performed without the administration of intravenous contrast. Multiplanar reformatted images are provided for review. Automated exposure control, iterative reconstruction, and/or weight based adjustment of the mA/kV was utilized to reduce the radiation dose to as low as reasonably achievable. COMPARISON: None available. CLINICAL HISTORY: Facial trauma, blunt; note, history of left mandibular surgery. FINDINGS: FACIAL BONES: Postsurgical changes of partial resection of the left body of the mandible with osseous graft noted. There is plate and screw fixation of the left aspect of the mandible extending from the angle of the mandible into the paracentral region. The hardware appears intact. Subtle irregularity of the nasal bones which appears new since the 2022 head CT. There is slight lateral apex angulation of the left nasal bone concerning for minimally displaced fractures. The facial bones are otherwise intact. There is no mandibular condyle dislocation. No suspicious bone lesion. ORBITS: Globes are intact. Lenses are normally located. There is right preseptal soft tissue swelling with mild swelling of the eyelids.  There is no evidence of postseptal swelling. No acute traumatic injury. No inflammatory change. SINUSES AND MASTOIDS: Mucosal thickening in the left maxillary sinus with thickening of the sinus wall suggestive of mucoperiosteal reaction. Findings are suggestive of chronic left maxillary sinusitis. There is focal chronic dehiscence in the floor of the left maxillary sinus without evidence of soft tissue communication between the oral cavity and the sinus. Small air fluid level in the right maxillary sinus. Recommend correlation with tenderness over the sinus. Additional scattered mucosal thickening in the ethmoid sinuses. Trace mucosal thickening in the sphenoid sinuses. Concha bullosa of the bilateral middle turbinates. No acute abnormality in the mastoids. SOFT TISSUES: Focal soft tissue swelling over the right zygomatic arch. Changes in the left submandibular space sequelae left sided neck dissection. There is a partially visualized 2.7 x 2.4 cm right submandibular/submental mass involving the subcutaneous tissues in the right anterior neck. This lesion is new compared to the CT neck on 04/30/23. Recommended correlation with nonemergent biopsy. No acute abnormality in other visualized soft tissues. IMPRESSION: 1. Subtle irregularity of the nasal bones with slight lateral apex angulation of the left nasal bone, concerning for minimally displaced fractures, new since the 2022 head CT. 2. Partially visualized 2.7 x 2.4 cm right submandibular/submental mass, new compared to the CT neck on 04/30/23. Recommend nonemergent biopsy. 3. Postsurgical changes of partial resection of the left body of the mandible with osseous graft and plate and screw fixation. 4. Chronic left maxillary sinusitis with focal chronic dehiscence in the floor of the left maxillary sinus without evidence of soft tissue communication between the oral cavity and the sinus. 5. Small air-fluid level in the right maxillary sinus. Electronically signed  by: Donnice Mania MD 04/10/2024 10:04 PM EST RP Workstation: [id number]   CT Head Wo Contrast Result Date: 04/10/2024 EXAM: CT HEAD WITHOUT CONTRAST 04/10/2024 09:47:19 PM TECHNIQUE: CT of the head was performed without the administration of intravenous contrast. Automated exposure control, iterative reconstruction, and/or weight based adjustment of the mA/kV was utilized to reduce the radiation dose to as low as reasonably achievable. COMPARISON: CT head 10/07/2020. CLINICAL HISTORY: Facial trauma, blunt. FINDINGS: BRAIN AND VENTRICLES: No acute hemorrhage. No evidence of acute infarct. No hydrocephalus. No extra-axial collection. No mass effect or midline shift. Mild generalized parenchymal volume loss. Appearance of mild to moderate chronic microvascular ischemic changes. Atherosclerosis of the carotid siphons. ORBITS: No acute abnormality. SINUSES: Mucosal thickening in the ethmoid and maxillary sinuses. SOFT TISSUES  AND SKULL: No acute soft tissue abnormality. No skull fracture. IMPRESSION: 1. No acute intracranial abnormality related to the facial trauma. 2. Mild generalized parenchymal volume loss. 3. Mild to moderate chronic microvascular ischemic changes. Electronically signed by: Donnice Mania MD 04/10/2024 09:54 PM EST RP Workstation: [id number]   CT Hip Right Wo Contrast Result Date: 04/10/2024 EXAM: CT OF THE RIGHT HIP WITHOUT IV CONTRAST 04/10/2024 09:47:19 PM TECHNIQUE: CT of the right hip was performed without the administration of intravenous contrast. Multiplanar reformatted images are provided for review. Automated exposure control, iterative reconstruction, and/or weight based adjustment of the mA/kV was utilized to reduce the radiation dose to as low as reasonably achievable. COMPARISON: None available. CLINICAL HISTORY: Fracture, hip. FINDINGS: BONES: Intratrochanteric right hip fracture, mildly comminuted and impacted, with varus angulation/foreshortening. No aggressive appearing osseous  abnormality or periostitis. SOFT TISSUE: Overlying lateral soft tissue hematoma measuring 4.4 x 9.9 cm. No soft tissue mass. JOINT: No significant degenerative changes. No osseous erosions. INTRAPELVIC CONTENTS: Limited images of the intrapelvic contents are unremarkable. IMPRESSION: 1. Intratrochanteric right hip fracture, as above. 2. Overlying lateral soft tissue hematoma. Electronically signed by: Pinkie Pebbles MD 04/10/2024 09:51 PM EST RP Workstation: [id number]   DG Chest 1 View Result Date: 04/10/2024 EXAM: 1 VIEW(S) XRAY OF THE CHEST 04/10/2024 08:17:43 PM COMPARISON: CT chest dated 04/30/2023. CLINICAL HISTORY: Fall. FINDINGS: LUNGS AND PLEURA: No focal pulmonary opacity. No pulmonary edema. No pleural effusion. No pneumothorax. HEART AND MEDIASTINUM: No acute abnormality of the cardiac and mediastinal silhouettes. BONES AND SOFT TISSUES: No acute osseous abnormality. IMPRESSION: 1. No acute cardiopulmonary process. Electronically signed by: Pinkie Pebbles MD 04/10/2024 08:24 PM EST RP Workstation: [id number]   DG Femur Min 2 Views Right Result Date: 04/10/2024 EXAM: 2 VIEW(S) XRAY OF THE RIGHT FEMUR 04/10/2024 08:17:43 PM COMPARISON: None available. CLINICAL HISTORY: fall, pain mid femur and R hip FINDINGS: BONES AND JOINTS: Acute intertrochanteric fracture of right hip with mild varus angulation. Degenerative changes in right knee. No focal osseous lesion. No joint dislocation. SOFT TISSUES: The soft tissues are unremarkable. IMPRESSION: 1. Acute intertrochanteric fracture of the right hip. Electronically signed by: Pinkie Pebbles MD 04/10/2024 08:23 PM EST RP Workstation: [id number]   DG Hip Unilat W or Wo Pelvis 2-3 Views Right Result Date: 04/10/2024 EXAM: 2 or 3 VIEW(S) XRAY OF THE RIGHT HIP 04/10/2024 08:17:43 PM COMPARISON: None available. CLINICAL HISTORY: fall fall FINDINGS: BONES AND JOINTS: The bones are diffusely osteopenic. There is an acute right femoral intertrochanteric  fracture which appears mildly comminuted and impacted. Sclerotic focus in the left femoral head measuring 11 mm, possibly a bone island. There is no dislocation. The hip joint is maintained. No significant degenerative changes. SOFT TISSUES: The soft tissues are unremarkable. IMPRESSION: 1. Acute right femoral intertrochanteric fracture, mildly comminuted and impacted. Electronically signed by: Greig Pique MD 04/10/2024 08:22 PM EST RP Workstation: [id number]      PROCEDURES:  Critical Care performed: No  .Laceration Repair  Date/Time: 04/11/2024 1:29 AM  Performed by: Dicky Anes, MD Authorized by: Dicky Anes, MD   Consent:    Consent obtained:  Verbal   Consent given by:  Patient   Risks, benefits, and alternatives were discussed: yes     Risks discussed:  Pain, infection and poor wound healing   Alternatives discussed:  No treatment Universal protocol:    Procedure explained and questions answered to patient or proxy's satisfaction: yes     Test results available: yes     Imaging studies available:  yes     Patient identity confirmed:  Verbally with patient Laceration details:    Location:  Face   Face location:  R eyebrow   Length (cm):  1.5   Depth (mm):  4 Pre-procedure details:    Preparation:  Imaging obtained to evaluate for foreign bodies Exploration:    Limited defect created (wound extended): no     Imaging outcome: foreign body not noted     Wound exploration: wound explored through full range of motion     Wound extent: areolar tissue not violated, fascia not violated, no foreign body, no signs of injury, no nerve damage, no tendon damage, no underlying fracture and no vascular damage     Contaminated: no   Treatment:    Area cleansed with:  Saline   Amount of cleaning:  Standard   Irrigation solution:  Sterile saline   Irrigation method:  Syringe   Visualized foreign bodies/material removed: no     Debridement:  None Skin repair:    Repair method:  Tissue  adhesive Approximation:    Approximation:  Close Repair type:    Repair type:  Simple Comments:     Tolerated well.  Good approximation.    MEDICATIONS ORDERED IN ED: Medications  acetaminophen  (OFIRMEV ) IV 1,000 mg (0 mg Intravenous Stopped 04/10/24 2031)  acetaminophen  (TYLENOL ) tablet 500 mg (has no administration in time range)  ALPRAZolam  (XANAX ) tablet 0.25 mg (0.25 mg Oral Given 04/11/24 0125)  escitalopram  (LEXAPRO ) tablet 5 mg (has no administration in time range)  pantoprazole  (PROTONIX ) EC tablet 40 mg (has no administration in time range)  HYDROcodone-acetaminophen  (NORCO/VICODIN) 5-325 MG per tablet 1-2 tablet (has no administration in time range)  heparin injection 5,000 Units (has no administration in time range)  0.9 %  sodium chloride  infusion (has no administration in time range)  HYDROmorphone (DILAUDID) injection 0.5 mg (has no administration in time range)  senna-docusate (Senokot-S) tablet 1 tablet (has no administration in time range)  fentaNYL (SUBLIMAZE) injection 25 mcg (25 mcg Intravenous Given 04/10/24 2014)  fentaNYL (SUBLIMAZE) injection 25 mcg (25 mcg Intravenous Given 04/10/24 2336)  fentaNYL (SUBLIMAZE) injection 50 mcg (50 mcg Intravenous Given 04/10/24 2041)  sodium chloride  0.9 % bolus 500 mL (0 mLs Intravenous Stopped 04/11/24 0017)  HYDROmorphone (DILAUDID) injection 0.5 mg (0.5 mg Intramuscular Given 04/10/24 2128)  sodium chloride  0.9 % bolus 500 mL (500 mLs Intravenous New Bag/Given 04/10/24 2336)     IMPRESSION / MDM / ASSESSMENT AND PLAN / ED COURSE  I reviewed the triage vital signs and the nursing notes.                              Differential diagnosis includes, but is not limited to,   Patient's presentation is most consistent with acute complicated illness / injury requiring diagnostic workup.   The patient is on the cardiac monitor to evaluate for evidence of arrhythmia and/or significant heart rate changes.   Clinical Course as  of 04/11/24 0131  Sat Apr 10, 2024  2104 Patient is in severe excruciating pain in her right hip after x-ray.  Additional fentanyl has not been helpful.  She has strong intact pulses normal sensation.  After x-ray of the leg does look slightly shortened compared to going to x-ray making me think that perhaps the x-rays may have induced to some muscular spasm or further change in orthopedic positioning.  Nursing I have attempted to pad, patient  is obviously in severe pain.  Will give hydromorphone at this time. [MQ]  2121 I spoke with Dr. lorelle of orthopedics regarding the intractable nature of pain and he advises will see in consult, will admit, he anticipates attempting to make her the first case the day.  He advises that nerve block not currently available, and that anesthesia services here do not provide block.  He does request noncontrast CT of the right hip for planning of surgery [MQ]    Clinical Course User Index [MQ] Dicky Anes, MD   Patient's pain much improved Reassessed at 11 PM.  Blood pressures improved had a brief hypotension, she does have leukocytosis but no obvious infection noted to this point.  Will clearly be evaluated and treated in the hospital with further care and treatment under the hospitalist service.  Excepted by Dr. Jackson.  We did discuss that orthopedics is currently planning to take the patient potentially OR in the morning, discussed significant leukocytosis with pending workup for source if infection is present, degree of pain improvement with treatments provided, labs and imaging studies etc.    FINAL CLINICAL IMPRESSION(S) / ED DIAGNOSES   Final diagnoses:  Closed fracture of right hip, initial encounter [facility])  Facial laceration, initial encounter  Facial mass  Closed fracture of nasal bone, initial encounter  Fall, initial encounter  Leukocytosis, unspecified type     Rx / DC Orders   ED Discharge Orders     None        Note:  This  document was prepared using Dragon voice recognition software and may include unintentional dictation errors.   Dicky Anes, MD 04/11/24 [phone]

## 2024-04-11 NOTE — Assessment & Plan Note (Signed)
 Alprazolam  0.25 mg p.o. twice daily as needed for anxiety

## 2024-04-11 NOTE — Transfer of Care (Signed)
 Immediate Anesthesia Transfer of Care Note  Patient: Claudia Friedman  Procedure(s) Performed: FIXATION, FRACTURE, INTERTROCHANTERIC, WITH INTRAMEDULLARY ROD (Right: Hip)  Patient Location: PACU  Anesthesia Type:General  Level of Consciousness: drowsy  Airway & Oxygen Therapy: Patient Spontanous Breathing and Patient connected to face mask oxygen  Post-op Assessment: Report given to RN  Post vital signs: stable  Last Vitals:  Vitals Value Taken Time  BP 121/56 04/11/24 10:00  Temp    Pulse 68 04/11/24 10:01  Resp 11 04/11/24 10:01  SpO2 100 % 04/11/24 10:01  Vitals shown include unfiled device data.  Last Pain:  Vitals:   04/11/24 0435  TempSrc: Oral  PainSc:          Complications: No notable events documented.

## 2024-04-11 NOTE — H&P (Signed)
 History and Physical    Patient: Claudia Friedman FMW:[id number] DOB: 1930/10/26 DOA: 04/10/2024 DOS: the patient was seen and examined on 04/11/2024 PCP: Alla Amis, MD  Patient coming from: Home  Chief Complaint:  Chief Complaint  Patient presents with   Fall   HPI: Claudia Friedman is a 89 y.o. 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  She is also status post CRT.  She is being considered for further treatment with chemotherapy.  She had been in her usual state of health until this evening.  She was trying to get to her bathroom when she accidentally fell on the level ground.  She sustained right intertrochanteric fracture.  Symptoms were associated with severe pain.  She was optimized in the ED with analgesics.  Seen and evaluated in the ED by orthopedics.  Patient has been planned for surgery in AM.  She denies any chest pain or recent history of coronary artery disease.  At baseline, patient ambulates with the use of a cane.  Review of Systems: As mentioned in the history of present illness. All other systems reviewed and are negative. Past Medical History:  Diagnosis Date   Anxiety    History of echocardiogram    a. 12/19/15: EF 60-65%, no RWMA, nl LV diastolic fxn, mild AI, LA nl, PASP nl   History of nuclear stress test    a. 12/19/15: no evidence of ischemia, EF 76%, low risk study   Hypertension    Past Surgical History:  Procedure Laterality Date   CHOLECYSTECTOMY     COLONOSCOPY N/A 10/09/2020   Procedure: COLONOSCOPY;  Surgeon: Maryruth Ole DASEN, MD;  Location: [facility] ENDOSCOPY;  Service: Endoscopy;  Laterality: N/A;   ESOPHAGOGASTRODUODENOSCOPY N/A 10/09/2020   Procedure: ESOPHAGOGASTRODUODENOSCOPY (EGD);  Surgeon: Maryruth Ole DASEN, MD;  Location: [facility] ENDOSCOPY;  Service: Endoscopy;  Laterality: N/A;   Social  History:  reports that she has never smoked. She has never used smokeless tobacco. She reports current alcohol use. She reports that she does not use drugs.  No Known Allergies  Family History  Problem Relation Age of Onset   CAD Other    Osteoporosis Other     Prior to Admission medications   Medication Sig Start Date End Date Taking? Authorizing Provider  acetaminophen  (TYLENOL ) 500 MG tablet Take 500 mg by mouth every 6 (six) hours as needed.    [provider]  ALPRAZolam  (XANAX ) 0.25 MG tablet Take 1 tablet (0.25 mg total) by mouth 2 (two) times daily as needed for anxiety. 10/11/20   Samtani, Jai-Gurmukh, MD  escitalopram  (LEXAPRO ) 5 MG tablet Take 5 mg by mouth daily. 07/20/20   [provider]  ferrous sulfate  325 (65 FE) MG EC tablet Take 1 tablet (325 mg total) by mouth 2 (two) times daily. 10/11/20 10/11/21  Samtani, Jai-Gurmukh, MD  pantoprazole  (PROTONIX ) 40 MG tablet Take 1 tablet (40 mg total) by mouth daily. 10/12/20   Samtani, Jai-Gurmukh, MD    Physical Exam: Vitals:   04/10/24 2255 04/10/24 2258 04/10/24 2300 04/10/24 2304  BP:   (!) 88/52   Pulse: 75 75 74   Resp: 18 13 13    Temp:    98 F (36.7 C)  TempSrc:    Oral  SpO2: 98% 94% 92%   Weight:      Height:       General: Patient is a frail elderly female was  seen laying in bed.  Pain is adequately controlled at this time. HEENT: Left facial bone is slightly deformed secondary to status post mandibulectomy in the past. Neck: Supple with no JVD Chest: Clinically clear with no added sounds Abdomen: Soft nontender with no organomegaly Extremities: Laterally rotated right lower extremity.  Tenderness to touch on right hip.  Loss of function in her lower extremity CNS shows no focal deficits  Data Reviewed: Labs reviewed show sodium of 138, potassium 3.5, chloride 100, bicarb 23, glucose 112, BUN 24, creatinine 1.09, calcium 8.2 WBC of 23, hemoglobin of 10.9, hematocrit 33.2, platelet count of  187 Urinalysis has been ordered and pending  X-ray shows Acute intertrochanteric fracture of the right hip.  CT scan confirmed intertrochanteric right hip fracture with overlying hematoma and soft tissue. Assessment and Plan: 89 year old female with right inter trochanteric fracture secondary to mechanical fall  #1.  Right intertrochanteric fracture: Seen and evaluated by orthopedics.  Patient is being planned for ORIF in AM.  Preop clearance pending EKG which has been ordered. Possible underlying osteoporosis/osteopenia may have been contributory.  Multimodal pain management is advised.  As needed Dilaudid.  #2.  Labile blood pressure most likely as a result of profuse opiate analgesics on admission.  Will limit opiate use.  Gentle IV fluids  #3.  History of squamous cell carcinoma of the oropharynx.  Status post mandibulectomy.  Follow-up with [facility] oncology for further treatment and management  #4.  History of anxiety disorder.  Consider as needed Ativan if symptoms do recur.  Ativan held at this time due to multiple opiates.  #5.  VTE prophylaxis SCDs.  Heparin held in anticipation for procedure in AM.     Advance Care Planning: Prior.  Procedural code in place at this time  Consults: orthopedics have been consulted int he ED  Family Communication: Son was not at bedside at this time. Care taker was present during ny visit  Severity of Illness: The appropriate patient status for this patient is INPATIENT. Inpatient status is judged to be reasonable and necessary in order to provide the required intensity of service to ensure the patient's safety. The patient's presenting symptoms, physical exam findings, and initial radiographic and laboratory data in the context of their chronic comorbidities is felt to place them at high risk for further clinical deterioration. Furthermore, it is not anticipated that the patient will be medically stable for discharge from the hospital within 2  midnights of admission.   * I certify that at the point of admission it is my clinical judgment that the patient will require inpatient hospital care spanning beyond 2 midnights from the point of admission due to high intensity of service, high risk for further deterioration and high frequency of surveillance required.*  Author: Maude MARLA Dart, MD 04/11/2024 12:04 AM  For on call review [email or web address].

## 2024-04-11 NOTE — Assessment & Plan Note (Signed)
 Home PPI

## 2024-04-12 ENCOUNTER — Encounter: Payer: Self-pay | Admitting: Orthopedic Surgery

## 2024-04-12 DIAGNOSIS — D099 Carcinoma in situ, unspecified: Secondary | ICD-10-CM

## 2024-04-12 DIAGNOSIS — M80051A Age-related osteoporosis with current pathological fracture, right femur, initial encounter for fracture: Secondary | ICD-10-CM | POA: Diagnosis not present

## 2024-04-12 DIAGNOSIS — E43 Unspecified severe protein-calorie malnutrition: Secondary | ICD-10-CM | POA: Insufficient documentation

## 2024-04-12 LAB — BASIC METABOLIC PANEL WITH GFR
Anion gap: 10 (ref 5–15)
BUN: 25 mg/dL — ABNORMAL HIGH (ref 8–23)
CO2: 26 mmol/L (ref 22–32)
Calcium: 8.1 mg/dL — ABNORMAL LOW (ref 8.9–10.3)
Chloride: 102 mmol/L (ref 98–111)
Creatinine, Ser: 1.02 mg/dL — ABNORMAL HIGH (ref 0.44–1.00)
GFR, Estimated: 52 mL/min — ABNORMAL LOW (ref >60)
Glucose, Bld: 96 mg/dL (ref 70–99)
Potassium: 4.4 mmol/L (ref 3.5–5.1)
Sodium: 138 mmol/L (ref 135–145)

## 2024-04-12 LAB — CBC
HCT: 26.9 % — ABNORMAL LOW (ref 36.0–46.0)
Hemoglobin: 8.8 g/dL — ABNORMAL LOW (ref 12.0–15.0)
MCH: 31.9 pg (ref 26.0–34.0)
MCHC: 32.7 g/dL (ref 30.0–36.0)
MCV: 97.5 fL (ref 80.0–100.0)
Platelets: 136 K/uL — ABNORMAL LOW (ref 150–400)
RBC: 2.76 MIL/uL — ABNORMAL LOW (ref 3.87–5.11)
RDW: 14.2 % (ref 11.5–15.5)
WBC: 11.2 K/uL — ABNORMAL HIGH (ref 4.0–10.5)
nRBC: 0 % (ref 0.0–0.2)

## 2024-04-12 MED ORDER — HYDROCODONE-ACETAMINOPHEN 5-325 MG PO TABS
1.0000 | ORAL_TABLET | Freq: Four times a day (QID) | ORAL | Status: AC | PRN
  Filled 2024-04-12 (×2): qty 1

## 2024-04-12 MED ORDER — HALOPERIDOL LACTATE 5 MG/ML IJ SOLN
1.0000 mg | Freq: Four times a day (QID) | INTRAMUSCULAR | Status: DC | PRN
Start: 2024-04-12 — End: 2024-04-15
  Administered 2024-04-12: 1 mg via INTRAVENOUS
  Filled 2024-04-12: qty 1

## 2024-04-12 MED ORDER — MORPHINE SULFATE (PF) 2 MG/ML IV SOLN: 1.0000 mg | INTRAVENOUS | Status: DC | PRN

## 2024-04-12 MED ORDER — SODIUM CHLORIDE 0.9 % IV BOLUS
500.0000 mL | Freq: Once | INTRAVENOUS | Status: AC
  Administered 2024-04-12: 500 mL via INTRAVENOUS

## 2024-04-12 MED ORDER — TRAMADOL HCL 50 MG PO TABS: 50.0000 mg | ORAL_TABLET | Freq: Four times a day (QID) | ORAL | 0 refills | Status: AC | PRN

## 2024-04-12 MED ORDER — ALPRAZOLAM 0.25 MG PO TABS
0.2500 mg | ORAL_TABLET | Freq: Once | ORAL | Status: AC
  Administered 2024-04-12: 0.25 mg via ORAL
  Filled 2024-04-12: qty 1

## 2024-04-12 MED ORDER — FE FUM-VIT C-VIT B12-FA 460-60-0.01-1 MG PO CAPS
1.0000 | ORAL_CAPSULE | Freq: Every day | ORAL | Status: DC
  Administered 2024-04-12 – 2024-04-14 (×2): 1 via ORAL
  Filled 2024-04-12 (×4): qty 1

## 2024-04-12 MED ORDER — ENSURE PLUS HIGH PROTEIN PO LIQD
237.0000 mL | Freq: Two times a day (BID) | ORAL | Status: DC
  Administered 2024-04-12 – 2024-04-15 (×6): 237 mL via ORAL

## 2024-04-12 MED ORDER — ENOXAPARIN SODIUM 40 MG/0.4ML IJ SOSY: 40.0000 mg | PREFILLED_SYRINGE | INTRAMUSCULAR | 0 refills | Status: AC

## 2024-04-12 NOTE — Progress Notes (Signed)
 Initial Nutrition Assessment  DOCUMENTATION CODES:   Severe malnutrition in context of chronic illness  INTERVENTION:   -Continue regular diet -Ensure Plus High Protein po BID, each supplement provides 350 kcal and 20 grams of protein  -Magic cup TID with meals, each supplement provides 290 kcal and 9 grams of protein  -MVI with minerals daily -30 ml free water flush via PEG every 4 hours for tube patency   NUTRITION DIAGNOSIS:   Severe Malnutrition related to chronic illness (maxillary cancer) as evidenced by moderate fat depletion, severe fat depletion, moderate muscle depletion, severe muscle depletion.  GOAL:   Patient will meet greater than or equal to 90% of their needs  MONITOR:   PO intake, Supplement acceptance  REASON FOR ASSESSMENT:   Consult Assessment of nutrition requirement/status, Hip fracture protocol  ASSESSMENT:   89 y.o. 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  She is also status post CRT.  She is being considered for further treatment with chemotherapy.  Patient admitted with right hip fracture s/p mechanical fall.   11/9- s/p  Procedure: Right Hip Intramedullary nail   Reviewed I/O's: +703 ml x 24 hours and +903 ml since admission  UOP: 200 ml x 24 hours  Spoke with patient and caregiver at bedside. Both pleasant and in good spirits today. She reports she has a fair appetite, usually eating small amounts. She reports she consumes 3-4 meals per day PTA (Breakfast: sausage biscuit, coffee, and fruit; Lunch: soup, Dinner: frozen dinner). Patient also drinks 1-2 Ensure drinks and mixes [employer] with almond milk.   Patient reports she has a history of left maxillary cancer; oral intake can be challenging as she has to swallow on her right side. She has been working hard on  eating and has found foods that she feels comfortable eating. Tolerating more complicated textures has improved; shared her excitement about eating a hot dog PTA. Patient still has PEG tube access, but she and caregiver report that they only use the tube to flush.   Reviewed weight history. Patient has experienced a 4.8% weight loss over the past 5 months, which is not significant for time frame. Patient denies weight loss and reports she has been working on her nutrition in attempt to not be depending on feeding tube. She also reports that she was supposed to start chemotherapy this week for cancer reoccurrence.   Discussed importance of good meal and supplement intake to promote healing. Patient is amenable to supplements.   Medications reviewed and include colace, lovenox , lexapro , and protonix .   Reviewed weight history; patient has experienced a 4.8% weight loss over the past 3 months  Medications reviewed and incliude coalce, lexapro , and protonix .   Labs reviewed.   NUTRITION - FOCUSED PHYSICAL EXAM:  Flowsheet Row Most Recent Value  Orbital Region Moderate depletion  Upper Arm Region Severe depletion  Thoracic and Lumbar Region Moderate depletion  Buccal Region Severe depletion  Temple Region Severe depletion  Clavicle Bone Region Severe depletion  Clavicle and Acromion Bone Region Severe depletion  Scapular Bone Region Severe depletion  Dorsal Hand Moderate depletion  Patellar Region Moderate depletion  Anterior Thigh Region Moderate depletion  Posterior Calf Region Moderate depletion  Edema (RD Assessment) None  Hair Reviewed  Eyes Reviewed  Mouth Reviewed  Skin Reviewed  Nails Reviewed    Diet Order:   Diet Order  Diet regular Room service appropriate? Yes; Fluid consistency: Thin  Diet effective now                   EDUCATION NEEDS:   Education needs have been addressed  Skin:  Skin Assessment: Skin Integrity Issues: Skin Integrity  Issues:: Incisions Incisions: closed right hip  Last BM:  04/10/24  Height:   Ht Readings from Last 1 Encounters:  04/10/24 5' 6 (1.676 m)    Weight:   Wt Readings from Last 1 Encounters:  04/10/24 59.6 kg    Ideal Body Weight:  59.1 kg  BMI:  Body mass index is 21.21 kg/m.  Estimated Nutritional Needs:   Kcal:  1600-1800  Protein:  80-95 grams  Fluid:  1.6-1.8 L    Claudia Friedman, RD, LDN, CDCES Registered Dietitian III Certified Diabetes Care and Education Specialist If unable to reach this RD, please use RD Inpatient group chat on secure chat between hours of 8am-4 pm daily

## 2024-04-12 NOTE — Progress Notes (Addendum)
   Subjective: 1 Day Post-Op Procedure(s) (LRB): FIXATION, FRACTURE, INTERTROCHANTERIC, WITH INTRAMEDULLARY ROD (Right) Patient reports pain as mild.   Patient is well, and has had no acute complaints or problems Denies any CP, SOB, ABD pain. We will start physical therapy today.   Objective: Vital signs in last 24 hours: Temp:  [97.5 F (36.4 C)-98.3 F (36.8 C)] 97.8 F (36.6 C) (11/10 0858) Pulse Rate:  [57-72] 72 (11/10 0450) Resp:  [9-19] 18 (11/10 0858) BP: (93-117)/(45-68) 98/55 (11/10 0858) SpO2:  [96 %-100 %] 100 % (11/10 0858)  Intake/Output from previous day: 11/09 0701 - 11/10 0700 In: 1002.5 [P.O.:40; I.V.:962.5] Out: 300 [Urine:200; Blood:100] Intake/Output this shift: No intake/output data recorded.  Recent Labs    04/10/24 2202 04/12/24 0740  HGB 10.9* 8.8*   Recent Labs    04/10/24 2202 04/12/24 0740  WBC 23.0* 11.2*  RBC 3.46* 2.76*  HCT 33.2* 26.9*  PLT 197 136*   Recent Labs    04/10/24 2202  NA 138  K 3.5  CL 100  CO2 23  BUN 24*  CREATININE 1.09*  GLUCOSE 112*  CALCIUM 8.2*   Recent Labs    04/10/24 2020  INR 1.1    EXAM General - Patient is Alert, Appropriate, and Oriented Extremity - Neurologically intact Sensation intact distally Intact pulses distally Dorsiflexion/Plantar flexion intact No cellulitis present Compartment soft Dressing - dressing C/D/I and no drainage Motor Function - intact, moving foot and toes well on exam.   Past Medical History:  Diagnosis Date   Anxiety    History of echocardiogram    a. 12/19/15: EF 60-65%, no RWMA, nl LV diastolic fxn, mild AI, LA nl, PASP nl   History of nuclear stress test    a. 12/19/15: no evidence of ischemia, EF 76%, low risk study   Hypertension     Assessment/Plan:   1 Day Post-Op Procedure(s) (LRB): FIXATION, FRACTURE, INTERTROCHANTERIC, WITH INTRAMEDULLARY ROD (Right) Principal Problem:   Hip fracture due to osteoporosis (HCC) Active Problems:   Anxiety    GERD (gastroesophageal reflux disease)   Depression  Estimated body mass index is 21.21 kg/m as calculated from the following:   Height as of this encounter: 5' 6 (1.676 m).   Weight as of this encounter: 59.6 kg. Advance diet Up with therapy Pain well controlled VSS Acute post op blood loss anemia - Hgb 8.8. start Fe supplement. Recheck Hgb in the am CM to assist with discharge to home vs SNF pending progress with PT   Patient will need 2-week follow-up with [facility] orthopedics Lovenox  40 mg subcu daily x 14 days at discharge. Patient can begin showering 3 days postop with honeycomb dressing.  At 7 days postop patient can remove honeycomb dressings and continue showering with no dressing.  DVT Prophylaxis - Lovenox , TED hose, and SCDs Weight-Bearing as tolerated to right leg   T. Medford Amber, PA-C [facility] 04/12/2024, 9:32 AM

## 2024-04-12 NOTE — Assessment & Plan Note (Signed)
 Of the left maxillary gingiva/retromolar trigone status post dental extraction, alveolectomy, segmental mandibulectomy, oral cavity tumor resection, left neck dissection, tracheostomy, left osteocutaneous radial forearm free flap reconstruction. Aspiration precaution due to high aspiration risk SLP consulted

## 2024-04-12 NOTE — Plan of Care (Signed)
  Problem: Pain Managment: Goal: General experience of comfort will improve and/or be controlled Outcome: Progressing   Problem: Safety: Goal: Ability to remain free from injury will improve Outcome: Progressing   Problem: Pain Management: Goal: Pain level will decrease Outcome: Progressing

## 2024-04-12 NOTE — Evaluation (Signed)
 Occupational Therapy Evaluation Patient Details Name: Claudia Friedman MRN: [id number] DOB: [birth year] Today's Date: 04/12/2024   History of Present Illness   Pt is a 89 y.o. female presented to hospital s/p ground level fall sustaining right intertrochanteric fracture s/p R hip rod on 11/9. 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     Clinical Impressions Pt was seen for OT evaluation this date.PTA, pt resides in a one level home with 24/7 caregiver. At baseline, she is IND with ADLs and IADLs, ambulates without AD in the home and use of SPC when going out. Denies falls prior to this.  Pt presents with deficits in strength, balance, pain management and safety awareness/confusion, affecting safe and optimal ADL completion. Pt currently requires Min A for bed mobility and STS from EOB to RW with cues for hand placement. Pt with large BM requiring return to supine for full clean-up requiring total assist at bed level. Pt with increased pain during rolling to bil sides in bed requiring Max A d/t pain and resisting. Pt intially oriented to person and place on entry, however a few moments later pt was crying and stating help me over and over and when told by her son via phone call during session that she was in the hospital she responded No I'm not. Min/Mod A to lateral scoot to [facility] for repositioning. Anticipate Max A for LB ADL management at this time.  Pt would benefit from skilled OT services to address noted impairments and functional limitations to maximize safety and independence while minimizing future risk of falls, injury, and readmission. Do anticipate the need for follow up OT services upon acute hospital DC.      If plan is discharge home, recommend the following:   A lot of help with walking and/or transfers;A lot of help with  bathing/dressing/bathroom;Assistance with cooking/housework;Assist for transportation;Help with stairs or ramp for entrance;Direct supervision/assist for medications management;Direct supervision/assist for financial management;Supervision due to cognitive status     Functional Status Assessment   Patient has had a recent decline in their functional status and demonstrates the ability to make significant improvements in function in a reasonable and predictable amount of time.     Equipment Recommendations   Other (comment) (defer to next venue)     Recommendations for Other Services         Precautions/Restrictions   Precautions Precautions: Fall Recall of Precautions/Restrictions: Impaired Restrictions Weight Bearing Restrictions Per Provider Order: Yes RLE Weight Bearing Per Provider Order: Weight bearing as tolerated     Mobility Bed Mobility Overal bed mobility: Needs Assistance Bed Mobility: Supine to Sit, Sit to Supine     Supine to sit: Min assist, Used rails, HOB elevated Sit to supine: Min assist   General bed mobility comments: LLE assist for supine<>sit at EOB x2 trials during session with use of bed features    Transfers Overall transfer level: Needs assistance Equipment used: Rolling walker (2 wheels) Transfers: Sit to/from Stand Sit to Stand: Min assist, From elevated surface           General transfer comment: to stand from EOB to RW with cues for hand placement and WB tolerance, pt not fully flattening foot on floor and unable to weight shift to take a lateral step; stood ~30 seconds      Balance Overall balance assessment: Needs assistance Sitting-balance support: No upper extremity supported Sitting  balance-Leahy Scale: Good   Postural control: Posterior lean Standing balance support: Single extremity supported Standing balance-Leahy Scale: Fair Standing balance comment: stood ~30 seconds, but unable to weight shift to take a lateral  step to the L                           ADL either performed or assessed with clinical judgement   ADL Overall ADL's : Needs assistance/impaired     Grooming: Wash/dry face;Set up;Sitting Grooming Details (indicate cue type and reason): EOB                     Toileting- Clothing Manipulation and Hygiene: Total assistance;Bed level Toileting - Clothing Manipulation Details (indicate cue type and reason): via rolling in bed d/t large BM requiring chux pad change             Vision         Perception         Praxis         Pertinent Vitals/Pain Pain Assessment Pain Assessment: Faces Faces Pain Scale: Hurts even more Pain Location: R hip Pain Descriptors / Indicators: Sore, Discomfort Pain Intervention(s): Monitored during session, Repositioned, Limited activity within patient's tolerance     Extremity/Trunk Assessment Upper Extremity Assessment Upper Extremity Assessment: Generalized weakness   Lower Extremity Assessment Lower Extremity Assessment: Generalized weakness;RLE deficits/detail RLE Deficits / Details: R ORIF RLE: Unable to fully assess due to pain RLE Sensation: WNL RLE Coordination: WNL       Communication Communication Communication: Impaired Factors Affecting Communication: Hearing impaired   Cognition Arousal: Alert Behavior During Therapy: Anxious               OT - Cognition Comments: oriented to person                 Following commands: Impaired Following commands impaired: Follows one step commands with increased time     Cueing  General Comments   Cueing Techniques: Verbal cues;Gestural cues;Tactile cues;Visual cues  grimacing and stating help me help me crying and increased confusion noted during session   Exercises Other Exercises Other Exercises: Edu on role of OT in acute setting.   Shoulder Instructions      Home Living Family/patient expects to be discharged to:: Private  residence Living Arrangements: Alone Available Help at Discharge: Personal care attendant;Available 24 hours/day Type of Home: House       Home Layout: One level     Bathroom Shower/Tub: Producer, Television/film/video: Standard     Home Equipment: Agricultural Consultant (2 wheels);Cane - single point          Prior Functioning/Environment Prior Level of Function : Needs assist             Mobility Comments: uses cane to move around the home ADLs Comments: IND with ADls and able to cook, clean and do chores around the home    OT Problem List: Decreased strength;Decreased activity tolerance;Impaired balance (sitting and/or standing);Pain;Decreased cognition;Decreased safety awareness   OT Treatment/Interventions: Self-care/ADL training;Therapeutic exercise;Balance training;Therapeutic activities;DME and/or AE instruction;Patient/family education      OT Goals(Current goals can be found in the care plan section)   Acute Rehab OT Goals Patient Stated Goal: Get out of here OT Goal Formulation: With patient Time For Goal Achievement: 04/26/24 Potential to Achieve Goals: Fair ADL Goals Pt Will Perform Lower Body Bathing: with min assist;sit to/from stand;sitting/lateral leans  Pt Will Perform Lower Body Dressing: with min assist;sit to/from stand;sitting/lateral leans Pt Will Transfer to Toilet: stand pivot transfer;ambulating;with contact guard assist   OT Frequency:  Min 2X/week    Co-evaluation              AM-PAC OT 6 Clicks Daily Activity     Outcome Measure Help from another person eating meals?: A Little Help from another person taking care of personal grooming?: A Little Help from another person toileting, which includes using toliet, bedpan, or urinal?: A Lot Help from another person bathing (including washing, rinsing, drying)?: A Lot Help from another person to put on and taking off regular upper body clothing?: A Lot Help from another person to put  on and taking off regular lower body clothing?: A Lot 6 Click Score: 14   End of Session Equipment Utilized During Treatment: Gait belt;Rolling walker (2 wheels) Nurse Communication: Mobility status  Activity Tolerance: Patient tolerated treatment well Patient left: in bed;with call bell/phone within reach;with bed alarm set;with family/visitor present  OT Visit Diagnosis: Other abnormalities of gait and mobility (R26.89);Muscle weakness (generalized) (M62.81);Pain Pain - Right/Left: Right Pain - part of body: Hip                Time: 8453-8363 OT Time Calculation (min): 50 min Charges:  OT General Charges $OT Visit: 1 Visit OT Evaluation $OT Eval Moderate Complexity: 1 Mod OT Treatments $Self Care/Home Management : 38-52 mins Jefferie Holston, OTR/L 04/12/24, 5:26 PM  Angelgabriel Willmore E Zoriana Oats 04/12/2024, 5:23 PM

## 2024-04-12 NOTE — Assessment & Plan Note (Signed)
-  Registered dietitian has been consulted 

## 2024-04-12 NOTE — Progress Notes (Signed)
 PROGRESS NOTE  Claudia Friedman  FMW:[id number] DOB: [birth year] DOA: 04/10/2024 PCP: Alla Amis, MD   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  Serum sodium is 138, potassium 3.5, chloride 100, bicarb 23, BUN of 24, serum creatinine 1.09, eGFR 48, nonfasting glucose 112, WBC 23, hemoglobin 10.9, platelets of 197.  COVID/influenza A/influenza B/RSV PCR were negative.  Right hip x-ray: Acute right femoral intertrochanteric fracture, mildly comminuted and impacted.  11/9: Patient admitted to hospitalist service for right intertrochanteric hip fracture.  11/9: Patient taken to the OR on 11/9 for fixation, of intratrochanteric with intramedullary rod of the right lower extremity.  On my evaluation, vitals showed 97.6, respiratory rate 17, heart rate 53, blood pressure 91/59, SpO2 100% on nasal cannula.  11/10: day 2 hospitalization and post op day 1.  Patient reports her area of squamous cell carcinoma mass on her neck and under her jaw have been bothering her for several months.  She reports that she has had surgery and has been evaluated by cancer doctors.  Assessment & Plan:   Principal Problem:   Hip fracture due to osteoporosis [facility]) Active Problems:   Anxiety   Squamous cell carcinoma in situ   GERD (gastroesophageal reflux disease)   Depression   Protein-calorie malnutrition, severe   Assessment and Plan:  * Hip fracture due to osteoporosis (HCC) Keep n.p.o. Patient will go to OR today with orthopedic service Symptomatic pain support: Hydrocodone-acetaminophen  5-325 mg every 6 hours as needed for moderate pain, 3 days ordered; morphine  2 mg IV every 4 hours as needed for severe pain, 1 day ordered  Squamous cell carcinoma in situ Of the left maxillary gingiva/retromolar  trigone status post dental extraction, alveolectomy, segmental mandibulectomy, oral cavity tumor resection, left neck dissection, tracheostomy, left osteocutaneous radial forearm free flap reconstruction. Aspiration precaution due to high aspiration risk SLP consulted  Anxiety Alprazolam  0.25 mg p.o. twice daily as needed for anxiety  Protein-calorie malnutrition, severe Registered dietitian has been consulted  Depression Escitalopram  5 mg daily resume  GERD (gastroesophageal reflux disease) Home PPI  DVT prophylaxis: Enoxaparin  Code Status: Full code Family Communication: Updated caregiver at bedside Disposition Plan: Pending clinical course, poor prognosis Level of care: Med-Surg  Consultants:  Orthopedic, SLP, registered dietitian  Procedures:  Status post fixation with intertrochanteric intramedullary chloride  Antimicrobials: None indicated at this  Subjective:  At bedside, patient awake alert and oriented and eating her lunch.  She is able to tell me her first and last name, age, location of hospital.  She is able to identify her caregiver at bedside by name.  She reports that swelling is somewhat difficult and tenderness especially at the site of her squamous cell carcinoma jaw region.  Reports this pain has been bothering her for several months.  Objective: Vitals:   04/12/24 0047 04/12/24 0450 04/12/24 0858 04/12/24 1049  BP: (!) 98/56 117/68 (!) 98/55 (!) 111/51  Pulse: 69 72  66  Resp: 18 18 18 17   Temp: 98.2 F (36.8 C) 98.1 F (36.7 C) 97.8 F (36.6 C) 98.1 F (36.7 C)  TempSrc:      SpO2: 96% 100% 100% 97%  Weight:      Height:        Intake/Output Summary (Last 24 hours) at 04/12/2024 1524 Last data filed at 04/12/2024 1300 Gross per 24  hour  Intake 882.49 ml  Output 480 ml  Net 402.49 ml   Filed Weights   04/10/24 1902  Weight: 59.6 kg   Examination:  General exam: Frail, cachectic, tearful Respiratory system: Clear to auscultation.  Respiratory effort normal. Cardiovascular system: S1 & S2 heard, RRR. No JVD, murmurs, rubs, gallops or clicks. No pedal edema. Gastrointestinal system: Abdomen is nondistended, soft and nontender. No organomegaly or masses felt. Normal bowel sounds heard. Central nervous system: Alert and oriented. No focal neurological deficits. Extremities: Decreased range of motion of the lower extremity especially the right Skin: Submandibular mass, approximately 3 to 4 cm in diameter in size is present. Psychiatry: Judgement and insight appear normal.  Depressed and tearful mood.   Data Reviewed: I have personally reviewed following labs and imaging studies  CBC: Recent Labs  Lab 04/10/24 2202 04/12/24 0740  WBC 23.0* 11.2*  HGB 10.9* 8.8*  HCT 33.2* 26.9*  MCV 96.0 97.5  PLT 197 136*   Basic Metabolic Panel: Recent Labs  Lab 04/10/24 2202 04/12/24 0740  NA 138 138  K 3.5 4.4  CL 100 102  CO2 23 26  GLUCOSE 112* 96  BUN 24* 25*  CREATININE 1.09* 1.02*  CALCIUM 8.2* 8.1*   GFR: Estimated Creatinine Clearance: 33.6 mL/min (A) (by C-G formula based on SCr of 1.02 mg/dL (H)).  Coagulation Profile: Recent Labs  Lab 04/10/24 2020  INR 1.1   Recent Results (from the past 240 hours)  Resp panel by RT-PCR (RSV, Flu A&B, Covid) Anterior Nasal Swab     Status: None   Collection Time: 04/10/24 11:16 PM   Specimen: Anterior Nasal Swab  Result Value Ref Range Status   SARS Coronavirus 2 by RT PCR NEGATIVE NEGATIVE Final    Comment: (NOTE) SARS-CoV-2 target nucleic acids are NOT DETECTED.  The SARS-CoV-2 RNA is generally detectable in upper respiratory specimens during the acute phase of infection. The lowest concentration of SARS-CoV-2 viral copies this assay can detect is 138 copies/mL. A negative result does not preclude SARS-Cov-2 infection and should not be used as the sole basis for treatment or other patient management decisions. A negative result may occur with  improper  specimen collection/handling, submission of specimen other than nasopharyngeal swab, presence of viral mutation(s) within the areas targeted by this assay, and inadequate number of viral copies(<138 copies/mL). A negative result must be combined with clinical observations, patient history, and epidemiological information. The expected result is Negative.  Fact Sheet for Patients:  bloggercourse.com  Fact Sheet for Healthcare Providers:  seriousbroker.it  This test is no t yet approved or cleared by the [place]  FDA and  has been authorized for detection and/or diagnosis of SARS-CoV-2 by FDA under an Emergency Use Authorization (EUA). This EUA will remain  in effect (meaning this test can be used) for the duration of the COVID-19 declaration under Section 564(b)(1) of the Act, 21 U.S.C.section 360bbb-3(b)(1), unless the authorization is terminated  or revoked sooner.       Influenza A by PCR NEGATIVE NEGATIVE Final   Influenza B by PCR NEGATIVE NEGATIVE Final    Comment: (NOTE) The Xpert Xpress SARS-CoV-2/FLU/RSV plus assay is intended as an aid in the diagnosis of influenza from Nasopharyngeal swab specimens and should not be used as a sole basis for treatment. Nasal washings and aspirates are unacceptable for Xpert Xpress SARS-CoV-2/FLU/RSV testing.  Fact Sheet for Patients: bloggercourse.com  Fact Sheet for Healthcare Providers: seriousbroker.it  This test is not yet approved or cleared  by the [place]  FDA and has been authorized for detection and/or diagnosis of SARS-CoV-2 by FDA under an Emergency Use Authorization (EUA). This EUA will remain in effect (meaning this test can be used) for the duration of the COVID-19 declaration under Section 564(b)(1) of the Act, 21 U.S.C. section 360bbb-3(b)(1), unless the authorization is terminated or revoked.     Resp  Syncytial Virus by PCR NEGATIVE NEGATIVE Final    Comment: (NOTE) Fact Sheet for Patients: bloggercourse.com  Fact Sheet for Healthcare Providers: seriousbroker.it  This test is not yet approved or cleared by the [place]  FDA and has been authorized for detection and/or diagnosis of SARS-CoV-2 by FDA under an Emergency Use Authorization (EUA). This EUA will remain in effect (meaning this test can be used) for the duration of the COVID-19 declaration under Section 564(b)(1) of the Act, 21 U.S.C. section 360bbb-3(b)(1), unless the authorization is terminated or revoked.  Performed at [facility], [address], [place], [place] [address]     Radiology Studies: DG HIP UNILAT WITH PELVIS 2-3 VIEWS RIGHT Result Date: 04/11/2024 CLINICAL DATA:  Fracture EXAM: DG HIP (WITH OR WITHOUT PELVIS) 2-3V RIGHT COMPARISON:  Radiograph yesterday FINDINGS: Six fluoroscopic spot views of the right hip submitted from the operating room. Femoral intramedullary nail with trans trochanteric and distal locking screw fixation of proximal femur fracture. Fluoroscopy time 1 minutes 15 seconds. Dose 14.51 mGy. IMPRESSION: Fluoroscopic spot views during proximal femur fracture ORIF. Electronically Signed   By: Andrea Gasman M.D.   On: 04/11/2024 11:39   DG C-Arm 1-60 Min-No Report Result Date: 04/11/2024 Fluoroscopy was utilized by the requesting physician.  No radiographic interpretation.   DG Chest Portable 1 View Result Date: 04/10/2024 EXAM: 1 VIEW(S) XRAY OF THE CHEST 04/10/2024 11:25:17 PM COMPARISON: 04/10/2024 CLINICAL HISTORY: Hip fracture, elevated white count FINDINGS: LUNGS AND PLEURA: No focal pulmonary opacity. No pulmonary edema. No pleural effusion. No pneumothorax. HEART AND MEDIASTINUM: Tortuous aorta with atherosclerosis. No acute abnormality of the cardiac silhouette. BONES AND SOFT TISSUES: No acute osseous abnormality.  IMPRESSION: 1. No acute cardiopulmonary process. Electronically signed by: Oneil Devonshire MD 04/10/2024 11:29 PM EST RP Workstation: [id number]   CT Cervical Spine Wo Contrast Result Date: 04/10/2024 EXAM: CT CERVICAL SPINE WITHOUT CONTRAST 04/10/2024 09:47:19 PM TECHNIQUE: CT of the cervical spine was performed without the administration of intravenous contrast. Multiplanar reformatted images are provided for review. Automated exposure control, iterative reconstruction, and/or weight based adjustment of the mA/kV was utilized to reduce the radiation dose to as low as reasonably achievable. COMPARISON: CT neck 04/30/2023. CLINICAL HISTORY: Facial trauma, blunt. FINDINGS: CERVICAL SPINE: BONES AND ALIGNMENT: Cervical lordosis is maintained. Similar appearance of anterolisthesis of C2 on C3. Similar appearance of anterolisthesis of C7 on T1 with slight asymmetric widening of the disc space which is unchanged from prior. No acute fracture or traumatic malalignment. DEGENERATIVE CHANGES: Degenerative endplate osteophytes and severe disc space narrowing at multiple levels which is similar to prior. Prominent partially visualized postsurgical changes in the left neck. Disc osteophyte complexes at multiple levels. There is mild spinal canal stenosis at C3-C4 through C5-C6. Facet arthrosis and uncovertebral hypertrophy at multiple levels. Significant foraminal stenosis at multiple levels throughout the cervical spine. SOFT TISSUES: No prevertebral soft tissue swelling. IMPRESSION: 1. No acute abnormality of the cervical spine related to the facial trauma. 2. Degenerative changes as above. 3. Alignment similar to prior. Electronically signed by: Donnice Mania MD 04/10/2024 10:11 PM EST RP Workstation: [id number]   CT  Maxillofacial Wo Contrast Result Date: 04/10/2024 EXAM: CT OF THE FACE WITHOUT CONTRAST 04/10/2024 09:47:19 PM TECHNIQUE: CT of the face was performed without the administration of intravenous contrast.  Multiplanar reformatted images are provided for review. Automated exposure control, iterative reconstruction, and/or weight based adjustment of the mA/kV was utilized to reduce the radiation dose to as low as reasonably achievable. COMPARISON: None available. CLINICAL HISTORY: Facial trauma, blunt; note, history of left mandibular surgery. FINDINGS: FACIAL BONES: Postsurgical changes of partial resection of the left body of the mandible with osseous graft noted. There is plate and screw fixation of the left aspect of the mandible extending from the angle of the mandible into the paracentral region. The hardware appears intact. Subtle irregularity of the nasal bones which appears new since the 2022 head CT. There is slight lateral apex angulation of the left nasal bone concerning for minimally displaced fractures. The facial bones are otherwise intact. There is no mandibular condyle dislocation. No suspicious bone lesion. ORBITS: Globes are intact. Lenses are normally located. There is right preseptal soft tissue swelling with mild swelling of the eyelids. There is no evidence of postseptal swelling. No acute traumatic injury. No inflammatory change. SINUSES AND MASTOIDS: Mucosal thickening in the left maxillary sinus with thickening of the sinus wall suggestive of mucoperiosteal reaction. Findings are suggestive of chronic left maxillary sinusitis. There is focal chronic dehiscence in the floor of the left maxillary sinus without evidence of soft tissue communication between the oral cavity and the sinus. Small air fluid level in the right maxillary sinus. Recommend correlation with tenderness over the sinus. Additional scattered mucosal thickening in the ethmoid sinuses. Trace mucosal thickening in the sphenoid sinuses. Concha bullosa of the bilateral middle turbinates. No acute abnormality in the mastoids. SOFT TISSUES: Focal soft tissue swelling over the right zygomatic arch. Changes in the left submandibular  space sequelae left sided neck dissection. There is a partially visualized 2.7 x 2.4 cm right submandibular/submental mass involving the subcutaneous tissues in the right anterior neck. This lesion is new compared to the CT neck on 04/30/23. Recommended correlation with nonemergent biopsy. No acute abnormality in other visualized soft tissues. IMPRESSION: 1. Subtle irregularity of the nasal bones with slight lateral apex angulation of the left nasal bone, concerning for minimally displaced fractures, new since the 2022 head CT. 2. Partially visualized 2.7 x 2.4 cm right submandibular/submental mass, new compared to the CT neck on 04/30/23. Recommend nonemergent biopsy. 3. Postsurgical changes of partial resection of the left body of the mandible with osseous graft and plate and screw fixation. 4. Chronic left maxillary sinusitis with focal chronic dehiscence in the floor of the left maxillary sinus without evidence of soft tissue communication between the oral cavity and the sinus. 5. Small air-fluid level in the right maxillary sinus. Electronically signed by: Donnice Mania MD 04/10/2024 10:04 PM EST RP Workstation: [id number]   CT Head Wo Contrast Result Date: 04/10/2024 EXAM: CT HEAD WITHOUT CONTRAST 04/10/2024 09:47:19 PM TECHNIQUE: CT of the head was performed without the administration of intravenous contrast. Automated exposure control, iterative reconstruction, and/or weight based adjustment of the mA/kV was utilized to reduce the radiation dose to as low as reasonably achievable. COMPARISON: CT head 10/07/2020. CLINICAL HISTORY: Facial trauma, blunt. FINDINGS: BRAIN AND VENTRICLES: No acute hemorrhage. No evidence of acute infarct. No hydrocephalus. No extra-axial collection. No mass effect or midline shift. Mild generalized parenchymal volume loss. Appearance of mild to moderate chronic microvascular ischemic changes. Atherosclerosis of the carotid siphons. ORBITS:  No acute abnormality. SINUSES: Mucosal  thickening in the ethmoid and maxillary sinuses. SOFT TISSUES AND SKULL: No acute soft tissue abnormality. No skull fracture. IMPRESSION: 1. No acute intracranial abnormality related to the facial trauma. 2. Mild generalized parenchymal volume loss. 3. Mild to moderate chronic microvascular ischemic changes. Electronically signed by: Donnice Mania MD 04/10/2024 09:54 PM EST RP Workstation: [id number]   CT Hip Right Wo Contrast Result Date: 04/10/2024 EXAM: CT OF THE RIGHT HIP WITHOUT IV CONTRAST 04/10/2024 09:47:19 PM TECHNIQUE: CT of the right hip was performed without the administration of intravenous contrast. Multiplanar reformatted images are provided for review. Automated exposure control, iterative reconstruction, and/or weight based adjustment of the mA/kV was utilized to reduce the radiation dose to as low as reasonably achievable. COMPARISON: None available. CLINICAL HISTORY: Fracture, hip. FINDINGS: BONES: Intratrochanteric right hip fracture, mildly comminuted and impacted, with varus angulation/foreshortening. No aggressive appearing osseous abnormality or periostitis. SOFT TISSUE: Overlying lateral soft tissue hematoma measuring 4.4 x 9.9 cm. No soft tissue mass. JOINT: No significant degenerative changes. No osseous erosions. INTRAPELVIC CONTENTS: Limited images of the intrapelvic contents are unremarkable. IMPRESSION: 1. Intratrochanteric right hip fracture, as above. 2. Overlying lateral soft tissue hematoma. Electronically signed by: Pinkie Pebbles MD 04/10/2024 09:51 PM EST RP Workstation: [id number]   DG Chest 1 View Result Date: 04/10/2024 EXAM: 1 VIEW(S) XRAY OF THE CHEST 04/10/2024 08:17:43 PM COMPARISON: CT chest dated 04/30/2023. CLINICAL HISTORY: Fall. FINDINGS: LUNGS AND PLEURA: No focal pulmonary opacity. No pulmonary edema. No pleural effusion. No pneumothorax. HEART AND MEDIASTINUM: No acute abnormality of the cardiac and mediastinal silhouettes. BONES AND SOFT TISSUES: No acute  osseous abnormality. IMPRESSION: 1. No acute cardiopulmonary process. Electronically signed by: Pinkie Pebbles MD 04/10/2024 08:24 PM EST RP Workstation: [id number]   DG Femur Min 2 Views Right Result Date: 04/10/2024 EXAM: 2 VIEW(S) XRAY OF THE RIGHT FEMUR 04/10/2024 08:17:43 PM COMPARISON: None available. CLINICAL HISTORY: fall, pain mid femur and R hip FINDINGS: BONES AND JOINTS: Acute intertrochanteric fracture of right hip with mild varus angulation. Degenerative changes in right knee. No focal osseous lesion. No joint dislocation. SOFT TISSUES: The soft tissues are unremarkable. IMPRESSION: 1. Acute intertrochanteric fracture of the right hip. Electronically signed by: Pinkie Pebbles MD 04/10/2024 08:23 PM EST RP Workstation: [id number]   DG Hip Unilat W or Wo Pelvis 2-3 Views Right Result Date: 04/10/2024 EXAM: 2 or 3 VIEW(S) XRAY OF THE RIGHT HIP 04/10/2024 08:17:43 PM COMPARISON: None available. CLINICAL HISTORY: fall fall FINDINGS: BONES AND JOINTS: The bones are diffusely osteopenic. There is an acute right femoral intertrochanteric fracture which appears mildly comminuted and impacted. Sclerotic focus in the left femoral head measuring 11 mm, possibly a bone island. There is no dislocation. The hip joint is maintained. No significant degenerative changes. SOFT TISSUES: The soft tissues are unremarkable. IMPRESSION: 1. Acute right femoral intertrochanteric fracture, mildly comminuted and impacted. Electronically signed by: Greig Pique MD 04/10/2024 08:22 PM EST RP Workstation: [id number]   Scheduled Meds:  docusate sodium  100 mg Oral BID   enoxaparin  (LOVENOX ) injection  40 mg Subcutaneous Q24H   escitalopram   5 mg Oral Daily   Fe Fum-Vit C-Vit B12-FA  1 capsule Oral QPC breakfast   feeding supplement  237 mL Oral BID BM   pantoprazole   40 mg Oral Daily   traZODone   50 mg Oral QHS    LOS: 1 day   Time spent: 35 minutes  Dr. Sherre [facility] If 7PM-7AM, please contact  night-coverage 04/12/2024, 3:24  PM

## 2024-04-12 NOTE — Evaluation (Signed)
 Physical Therapy Evaluation Patient Details Name: Claudia Friedman MRN: [id number] DOB: 28-Aug-1930 Today's Date: 04/12/2024  History of Present Illness  Pt is a 89 y.o. 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 Pt sustained right intertrochanteric fracture secondary to mechanical fall, and is s/p fixation fracture intertrochanteric with intramedullary rod (right). PMH includes: HTN, anxiety, GERD (gastroesophageal reflux disease) and Depression.   Clinical Impression  Pt was pleasant and motivated to participate during the session and put forth good effort throughout. Pt was educated on WBAT status due to recent IM nail and was able to sit EOB with minA for LE sequencing. Pt was able to tolerate transferring from sit to stand on RA with SpO2 (monitored before, during and post activity) in the low 90s (92-93). Pt denies any dizziness upon standing EOB and required minA. Pt was unable to tolerate marching in place or ambulation to chair 2/2 pain. Pt was hesitant to accept weight through the R LE and will benefit from continued PT services upon discharge to safely address deficits listed in patient problem list for decreased caregiver assistance and eventual return to PLOF.    On arrival pts SpO2 was assessed at 99% on 2L with nursing contacted, who gave the okay to trial on RA --end of session assessed in low 90's and per nursing put back on 2L.        If plan is discharge home, recommend the following: A little help with walking and/or transfers;A little help with bathing/dressing/bathroom;Assistance with cooking/housework;Assist for transportation;Help with stairs or ramp for entrance   Can travel by private vehicle        Equipment Recommendations    Recommendations for Other Services       Functional Status Assessment Patient has had  a recent decline in their functional status and demonstrates the ability to make significant improvements in function in a reasonable and predictable amount of time.     Precautions / Restrictions Precautions Precautions: Fall Recall of Precautions/Restrictions: Impaired Restrictions Weight Bearing Restrictions Per Provider Order: Yes RLE Weight Bearing Per Provider Order: Weight bearing as tolerated      Mobility  Bed Mobility Overal bed mobility: Needs Assistance Bed Mobility: Supine to Sit     Supine to sit: Min assist     General bed mobility comments: due to ORIF needed LE assist    Transfers Overall transfer level: Needs assistance Equipment used: Rolling walker (2 wheels) Transfers: Sit to/from Stand Sit to Stand: Min assist           General transfer comment: needed UE support due to posterior lean and WB tolerance    Ambulation/Gait: unable                   Stairs            Wheelchair Mobility     Tilt Bed    Modified Rankin (Stroke Patients Only)       Balance Overall balance assessment: Needs assistance Sitting-balance support: No upper extremity supported Sitting balance-Leahy Scale: Good   Postural control: Posterior lean Standing balance support: Single extremity supported Standing balance-Leahy Scale: Fair Standing balance comment: needed cue to accept weight onto R LE -- was not able to tolerate standing due to pain  Pertinent Vitals/Pain Pain Assessment Pain Assessment: Faces Faces Pain Scale: No hurt Breathing: normal Negative Vocalization: none Facial Expression: smiling or inexpressive Body Language: relaxed Consolability: no need to console PAINAD Score: 0 Pain Descriptors / Indicators: Sore, Discomfort    Home Living Family/patient expects to be discharged to:: Private residence Living Arrangements: Alone Available Help at Discharge: Personal care  attendant;Available 24 hours/day Type of Home: House         Home Layout: One level Home Equipment: Agricultural Consultant (2 wheels);Cane - single point      Prior Function Prior Level of Function : Needs assist             Mobility Comments: ind amb in the home without AD, SPC use for limited outdoor distances. No other falls other than admitting fall.  ADLs Comments: ind with dressing, mostly ind with bathing with occasional help for drying off. able to cook, clean and do chores around the home      Extremity/Trunk Assessment        Lower Extremity Assessment Lower Extremity Assessment: RLE deficits/detail RLE Deficits / Details: R ORIF RLE: Unable to fully assess due to pain RLE Sensation: WNL RLE Coordination: WNL       Communication   Communication Communication: Impaired Factors Affecting Communication: Hearing impaired    Cognition Arousal: Lethargic Behavior During Therapy: Flat affect   PT - Cognitive impairments: Difficult to assess Difficult to assess due to: Hard of hearing/deaf, Impaired communication                     PT - Cognition Comments: Pt needed more time for processing Following commands: Impaired Following commands impaired: Follows one step commands with increased time     Cueing Cueing Techniques: Verbal cues, Gestural cues, Tactile cues, Visual cues     General Comments      Exercises     Assessment/Plan    PT Assessment Patient needs continued PT services  PT Problem List Decreased strength;Decreased range of motion;Decreased activity tolerance;Decreased balance;Decreased mobility;Decreased coordination;Decreased cognition;Decreased knowledge of use of DME;Decreased safety awareness;Decreased knowledge of precautions;Pain       PT Treatment Interventions DME instruction;Gait training;Stair training;Functional mobility training;Therapeutic activities;Therapeutic exercise;Balance training;Neuromuscular  re-education;Cognitive remediation;Patient/family education    PT Goals (Current goals can be found in the Care Plan section)  Acute Rehab PT Goals Patient Stated Goal: getting back to life and doing ADLs PT Goal Formulation: With patient Time For Goal Achievement: 04/25/24 Potential to Achieve Goals: Good    Frequency Min 2X/week     Co-evaluation               AM-PAC PT 6 Clicks Mobility  Outcome Measure Help needed turning from your back to your side while in a flat bed without using bedrails?: A Little Help needed moving from lying on your back to sitting on the side of a flat bed without using bedrails?: A Little Help needed moving to and from a bed to a chair (including a wheelchair)?: A Lot Help needed standing up from a chair using your arms (e.g., wheelchair or bedside chair)?: A Lot Help needed to walk in hospital room?: A Lot Help needed climbing 3-5 steps with a railing? : Total 6 Click Score: 13    End of Session Equipment Utilized During Treatment: Gait belt Activity Tolerance: Patient limited by pain Patient left: in bed Nurse Communication: Mobility status PT Visit Diagnosis: Other abnormalities of gait and mobility (R26.89);Muscle weakness (generalized) (M62.81);Unsteadiness on  feet (R26.81);History of falling (Z91.81);Pain Pain - Right/Left: Right Pain - part of body: Hip    Time: 1107-1130 PT Time Calculation (min) (ACUTE ONLY): 23 min   Charges:                 Corean Newport, SPT 04/12/24, 2:48 PM

## 2024-04-13 DIAGNOSIS — M80051A Age-related osteoporosis with current pathological fracture, right femur, initial encounter for fracture: Secondary | ICD-10-CM | POA: Diagnosis not present

## 2024-04-13 LAB — BASIC METABOLIC PANEL WITH GFR
Anion gap: 10 (ref 5–15)
BUN: 26 mg/dL — ABNORMAL HIGH (ref 8–23)
CO2: 24 mmol/L (ref 22–32)
Calcium: 7.9 mg/dL — ABNORMAL LOW (ref 8.9–10.3)
Chloride: 104 mmol/L (ref 98–111)
Creatinine, Ser: 0.97 mg/dL (ref 0.44–1.00)
GFR, Estimated: 55 mL/min — ABNORMAL LOW (ref >60)
Glucose, Bld: 90 mg/dL (ref 70–99)
Potassium: 4.1 mmol/L (ref 3.5–5.1)
Sodium: 138 mmol/L (ref 135–145)

## 2024-04-13 LAB — CBC
HCT: 26.1 % — ABNORMAL LOW (ref 36.0–46.0)
Hemoglobin: 8.5 g/dL — ABNORMAL LOW (ref 12.0–15.0)
MCH: 31.7 pg (ref 26.0–34.0)
MCHC: 32.6 g/dL (ref 30.0–36.0)
MCV: 97.4 fL (ref 80.0–100.0)
Platelets: 131 K/uL — ABNORMAL LOW (ref 150–400)
RBC: 2.68 MIL/uL — ABNORMAL LOW (ref 3.87–5.11)
RDW: 14.3 % (ref 11.5–15.5)
WBC: 15.1 K/uL — ABNORMAL HIGH (ref 4.0–10.5)
nRBC: 0 % (ref 0.0–0.2)

## 2024-04-13 MED ORDER — SODIUM CHLORIDE 0.9 % IV BOLUS
250.0000 mL | Freq: Once | INTRAVENOUS | Status: AC
  Administered 2024-04-13: 250 mL via INTRAVENOUS

## 2024-04-13 NOTE — Progress Notes (Signed)
 Nutrition Follow-up  DOCUMENTATION CODES:   Severe malnutrition in context of chronic illness  INTERVENTION:   -Continue regular diet -Continue Ensure Plus High Protein po BID, each supplement provides 350 kcal and 20 grams of protein  -Continue Magic cup TID with meals, each supplement provides 290 kcal and 9 grams of protein  -Continue MVI with minerals daily -Continue 30 ml free water flush via PEG every 4 hours for tube patency  -Case discussed with RN, MD< and SLP; plan for palliative care consult to address goals of care  NUTRITION DIAGNOSIS:   Severe Malnutrition related to chronic illness (maxillary cancer) as evidenced by moderate fat depletion, severe fat depletion, moderate muscle depletion, severe muscle depletion.  Ongoing  GOAL:   Patient will meet greater than or equal to 90% of their needs  Progressing   MONITOR:   PO intake, Supplement acceptance  REASON FOR ASSESSMENT:   Consult Assessment of nutrition requirement/status, Hip fracture protocol  ASSESSMENT:   89 y.o. 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  She is also status post CRT.  She is being considered for further treatment with chemotherapy.   11/9- s/p  Procedure: Right Hip Intramedullary nail   Patient sleeping soundly at time of visit. RD spoke with caregiver at bedside, who reports that patient was just given some medication for agitation. She reports no changes in nutritional status from assessment yesterday. Reviewed plan of care; agreeable to continue.   Per caregiver, PTA she was flushing the tube with one syringe of bottled water daily. Informed of plan for maintenance flushes, which she agree with with.   Case discussed with SLP, who reports no changes in diet per evaluation, but is concerned about patient's ability to take  in adequate nutrition. RD shared assessment results from yesterday. Case discussed with MD; plan for palliative care consult for goals of care. Informed team that while patient has a PEG tube, she expressed desire to eat and not be dependent on it, however, RD suspects that patient will need enteral nutrition support if she desires full care and undergoes cancer treatments.   Labs reviewed.    Diet Order:   Diet Order             Diet regular Room service appropriate? Yes with Assist; Fluid consistency: Thin  Diet effective now                   EDUCATION NEEDS:   Education needs have been addressed  Skin:  Skin Assessment: Skin Integrity Issues: Skin Integrity Issues:: Incisions Incisions: closed right hip  Last BM:  04/10/24  Height:   Ht Readings from Last 1 Encounters:  04/10/24 5' 6 (1.676 m)    Weight:   Wt Readings from Last 1 Encounters:  04/10/24 59.6 kg    Ideal Body Weight:  59.1 kg  BMI:  Body mass index is 21.21 kg/m.  Estimated Nutritional Needs:   Kcal:  1600-1800  Protein:  80-95 grams  Fluid:  1.6-1.8 L    Margery ORN, RD, LDN, CDCES Registered Dietitian III Certified Diabetes Care and Education Specialist If unable to reach this RD, please use RD Inpatient group chat on secure chat between hours of 8am-4 pm daily

## 2024-04-13 NOTE — Evaluation (Signed)
 Clinical/Bedside Swallow Evaluation Patient Details  Name: Claudia Friedman MRN: [id number] Date of Birth: 1930/09/14  Today's Date: 04/13/2024 Time: SLP Start Time (ACUTE ONLY): 1255 SLP Stop Time (ACUTE ONLY): 1355 SLP Time Calculation (min) (ACUTE ONLY): 60 min  Past Medical History:  Past Medical History:  Diagnosis Date   Anxiety    History of echocardiogram    a. 12/19/15: EF 60-65%, no RWMA, nl LV diastolic fxn, mild AI, LA nl, PASP nl   History of nuclear stress test    a. 12/19/15: no evidence of ischemia, EF 76%, low risk study   Hypertension    Past Surgical History:  Past Surgical History:  Procedure Laterality Date   CHOLECYSTECTOMY     COLONOSCOPY N/A 10/09/2020   Procedure: COLONOSCOPY;  Surgeon: Maryruth Ole DASEN, MD;  Location: [facility] ENDOSCOPY;  Service: Endoscopy;  Laterality: N/A;   ESOPHAGOGASTRODUODENOSCOPY N/A 10/09/2020   Procedure: ESOPHAGOGASTRODUODENOSCOPY (EGD);  Surgeon: Maryruth Ole DASEN, MD;  Location: [facility] ENDOSCOPY;  Service: Endoscopy;  Laterality: N/A;   INTRAMEDULLARY (IM) NAIL INTERTROCHANTERIC Right 04/11/2024   Procedure: FIXATION, FRACTURE, INTERTROCHANTERIC, WITH INTRAMEDULLARY ROD;  Surgeon: Lorelle Hussar, MD;  Location: [facility] ORS;  Service: Orthopedics;  Laterality: Right;   HPI:  Pt is a 89 y.o. 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  She has been following with [facility] closely for the Cancer; see a new Imaging report.  She is being considered for further treatment with chemotherapy per chart.  She is also status post CRT. She had been in her usual state of health when she fell on the level ground and sustained a right intertrochanteric fracture.  Symptoms were associated with severe pain.  She was  optimized in the ED with analgesics.  Seen and evaluated in the ED by orthopedics- now 2 Days Post-Op Procedure(s) (LRB):  FIXATION, FRACTURE, INTERTROCHANTERIC, WITH INTRAMEDULLARY ROD (Right) per chart note.    Pt lives at home w/ 24/7 Caregiver support per NSG.   2 CXRs at this admit: both No acute cardiopulmonary process.  CT Soft Tissue- 02/09/2024: New enlarged right level IB lymph node consistent with biopsy-proven metastatic squamous cell carcinoma. 2. Postsurgical changes as above.    Assessment / Plan / Recommendation  Clinical Impression  Pt seen for BSE today. Pt awake/alert and verbal. She answered basic questions re: self and followed 1step commands w/ cue. She was constantly distracted by discomfort exhibiting fidgiting in the bed and light groans whey trying to reposition herself. Son was present in the room. Pt was able to feed self w/ full setup and positioning given. OF NOTE: pt was given Haldol last night d/t agitation per NSG report. She was lethargic this morning but has since alerted as the medicine wore off.  On [place] O2 support since surgery: 3L; afebrile. WBC elevated.   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 Recently with recurrence along L mandibular gingiva now s/p composite resection with left segmental mandibulectomy, left neck dissection, tracheostomy, with left radial forearm free flap reconstruction and botox to the right submandibular gland on 06/20/23. Pt was seen at [facility] following her surgery for few tx session  by ST services, 6-12/2023(also MBSS in 08/2023 which cleared her for Dysphagia level 1 diet w/ Thin liquids) w/ Recommended Compensatory Techniques: Upright 90 degrees, External pacing, Check for L pocketing, Check for L anterior loss, Double swallow, Alternate  liquids and solids, Head tilt Right.  She advanced from Puree > Bite-size foods in her diet (self selection of foods for best comfort) per note. Continued oral phase issues described: shirt has become wet with saliva leaking from asymmetric left side region of oral cavity due to aforementioned left containment deficit- labial asymmetry and closure deficit. Lacks left Dentition impacting mastication. Inability to maintain proper location of food boluses orally..  She has a PEG -- not using as she prefers to maintain an oral diet now. PO intake is primary w/ no significant wt loss in ~5 months per Dietician note and is using a drink supplement(Ensure) 1x daily w/ the 3-4 meals daily per Caregiver.  Pt appears to present w/ Primary oral phase dysphagia and grossly functional pharyngeal phase swallowing in setting of Known oral phase Dysphagia- see chart notes from [facility]. Sensorimotor deficits present in setting of resection/surgery- see chart notes. Pt has compensated more than adequately in the past several months and has not had to rely on PEG TFs for nutrition. She has consumed an oral diet (primary) w/ No noted/reported Pulmonary decline PTA- see CXRs.  During this assessment, No immediate, overt clinical s/s of aspiration during po trials/oral intake this morning. However, despite pt's previously reduced risk for aspiration, pt Now appears at risk for aspiration/aspiration pneumonia in setting of New medical issues. When following aspiration precautions and given support, the risk can be reduced but she is Not risk-free. Challenging factors that could impact her oropharyngeal swallowing include Pain/discomfort(NSG aware), fatigue/weakness/deconditioning, recent Fall/surgery/hospitalization, and Advanced Age. These factors can increase risk for aspiration, dysphagia as well as decreased oral intake overall.   During po trials, pt consumed po consistencies w/ no immediate, overt coughing, decline in vocal  quality, or change in respiratory presentation during/post trials. Oral phase revealed decreased Left labial closure w/ anterior leakage of thin liquids and slower, deliberate bolus management/mastication of textured foods- purees are easiest. Pt was able to maintain control of boluses for bolus propulsion/A-P transfer for swallowing. Oral clearing achieved w/ time, f/u swallows(dry). Highlighted moistened, soft foods only for conservation of energy/chewing.  OM Exam: chronic left labial weakness/dec'd ROM/strength; adequate lingual ROM/strength. Speech intelligible. Pt fed self w/ full setup support- she was significantly distracted by her Discomfort (R hip).   Recommend continue a Regular consistency diet for Choice Options w/ well-Chopped/Cut meats, well-moistened foods; Thin liquids -- monitor Cup use and pt should Hold Cup when drinking. Expect anterior leakage and provide towel under chin. Recommend aspiration precautions as pt uses at Baseline, including small bites/sips slowly and lingual sweep to clear orally b/t bites. Rest Breaks recommended during meals for conservation of energy as well as the Choice of easy to chew foods. Tray setup/Prep of foods and Positioning support for upright sitting for oral intake/meals.  Pills CRUSHED in Puree for safer, easier swallowing -- pt does this at home, and it was encouraged now and to continue at D/C to the Son. Briefly discussed w/ Son the possible need for PEG TFs as support for nutrition/hydration if pt is unable to meet her needs orally at this time; also increased water flushes to support pt. MD/Dietician updated.  Education given on CRUSHED Pills in Puree; food consistencies/prep and easy to chew options; rest breaks and  tactics for conservation of energy during meals; general aspiration precautions to pt and Son. Encouraged Dietician f/u for support. Recommend a Palliative Care consult for GOC discussion moving forward in setting of pt's Multiple  Comorbidities.  MD/NSG update and will reconsult if any new needs arise while admitted. Suspect pt is close to/at her swallowing Baseline BUT NOW HAS NEW medical issues that could negatively impact safety w/ oral intake. Son agreed. Precautions posted in room, chart.  SLP Visit Diagnosis: Dysphagia, oropharyngeal phase (R13.12) (known baseline of oral phase dysphagia s/p resection/surgical repair; deconditioned/fatigued; advanced Age; acture fall/hip fx; Pain)    Aspiration Risk  Mild aspiration risk;Risk for inadequate nutrition/hydration (increased risk d/t New medical factors)    Diet Recommendation   Thin;Age appropriate regular (w/ mech soft meats, even minced meats/foods as needed; gravies to moisten) = continue a Regular consistency diet for Choice Options w/ well-Chopped/Cut meats, well-moistened foods; Thin liquids -- monitor Cup use and pt should Hold Cup when drinking. Expect anterior leakage and provide towel under chin. Recommend aspiration precautions as pt uses at Baseline, including small bites/sips slowly and lingual sweep to clear orally b/t bites. Rest Breaks recommended during meals for conservation of energy as well as the Choice of easy to chew foods. Tray setup/Prep of foods and Positioning support for upright sitting for oral intake/meals.   Medication Administration: Crushed with puree    Other  Recommendations Recommended Consults:  (Palliative Care consult; Dietician) Oral Care Recommendations: Oral care BID;Oral care before and after PO;Patient independent with oral care;Staff/trained caregiver to provide oral care (support)     Assistance Recommended at Discharge  FULL at meals  Functional Status Assessment Patient has had a recent decline in their functional status and/or demonstrates limited ability to make significant improvements in function in a reasonable and predictable amount of time  Frequency and Duration  (n/a currently)   (n/a currently)       Prognosis  Prognosis for improved oropharyngeal function: Fair Barriers to Reach Goals: Time post onset;Severity of deficits Barriers/Prognosis Comment: known baseline of oral phase dysphagia s/p resection/surgical repair; deconditioned/fatigued; advanced Age; acture fall/hip fx; Pain      Swallow Study   General Date of Onset: 04/10/24 HPI: Pt is a 89 y.o. 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  She has been following with [facility] closely for the Cancer; see a new Imaging report.  She is being considered for further treatment with chemotherapy per chart.  She is also status post CRT. She had been in her usual state of health when she fell on the level ground and sustained a right intertrochanteric fracture.  Symptoms were associated with severe pain.  She was optimized in the ED with analgesics.  Seen and evaluated in the ED by orthopedics- now 2 Days Post-Op Procedure(s) (LRB):  FIXATION, FRACTURE, INTERTROCHANTERIC, WITH INTRAMEDULLARY ROD (Right) per chart note.    Pt lives at home w/ 24/7 Caregiver support per NSG.   2 CXRs at this admit: both No acute cardiopulmonary process. Type of Study: Bedside Swallow Evaluation Previous Swallow Assessment: MBSS 08/2023; dysphagia tx 6-12/2023 Diet Prior to this Study: Regular;Thin liquids (Level 0) (soft, cut foods at home per Caregiver. Son) Temperature Spikes Noted:  (wbc 15.1) Respiratory Status: Nasal cannula (3L) History of Recent Intubation: Yes  Total duration of intubation (days): 1 days (for surgery) Date extubated: 04/11/24 Behavior/Cognition: Alert;Cooperative;Pleasant mood;Distractible;Requires cueing Oral Cavity Assessment: Within Functional Limits Oral Care Completed by SLP: Recent completion by  staff Oral Cavity - Dentition: Missing dentition (left- baseline) Vision: Functional for self-feeding Self-Feeding Abilities: Able to feed self;Needs assist;Needs set up Patient Positioning: Upright in bed (full positioning) Baseline Vocal Quality: Normal;Low vocal intensity (slight) Volitional Cough:  (Fair (inspiratory effort reduced)) Volitional Swallow: Able to elicit    Oral/Motor/Sensory Function Overall Oral Motor/Sensory Function: Moderate impairment (Baseline-  see chart notes of surgical repair s/p resection) Facial ROM: Reduced left;Suspected CN VII (facial) dysfunction Facial Symmetry: Abnormal symmetry left;Suspected CN VII (facial) dysfunction Facial Strength: Reduced left;Suspected CN VII (facial) dysfunction Lingual ROM: Within Functional Limits Lingual Symmetry: Within Functional Limits Lingual Strength: Within Functional Limits   Ice Chips Ice chips: Not tested   Thin Liquid Thin Liquid: Impaired Presentation: Cup;Self Fed (10+ trials) Oral Phase Impairments: Reduced labial seal (left side) Oral Phase Functional Implications: Left anterior spillage Pharyngeal  Phase Impairments:  (no immediate, overt s/s noted) Other Comments: pt cued/educated to slow down when drinking    Nectar Thick Nectar Thick Liquid: Not tested   Honey Thick Honey Thick Liquid: Not tested   Puree Puree: Impaired Presentation: Spoon;Self Fed (3 trials) Oral Phase Impairments: Reduced labial seal (left side) Oral Phase Functional Implications: Prolonged oral transit Pharyngeal Phase Impairments:  (no overt)   Solid     Solid: Impaired Presentation: Self Fed;Spoon Oral Phase Impairments: Reduced labial seal;Impaired mastication (left side) Oral Phase Functional Implications: Prolonged oral transit;Impaired mastication Pharyngeal Phase Impairments:  (no overt)        Comer Portugal, MS, CCC-SLP Speech Language Pathologist Rehab Services; [facility] [place] [phone]  (ascom) Erice Ahles 04/13/2024,3:15 PM

## 2024-04-13 NOTE — Progress Notes (Addendum)
 SLP Cancellation Note  Patient Details Name: Claudia Friedman MRN: [id number] DOB: 18-Jul-1930   Cancelled treatment:       Reason Eval/Treat Not Completed: Fatigue/lethargy limiting ability to participate;Patient's level of consciousness;Patient not medically ready (chart reviewed; consulted NSG re: pt's status this morning. Caregiver present in room and answered questions.)  Attempted BSE this morning. Pt is lethargic and unable to arouse sufficiently to safely participate in the BSE. Per NSG and Caregiver, pt received Haldol last evening d/t agitation; she is currently lethargic and not arousing adequately to verbal/tactile for assessment.  Strongly advised Caregiver NOT giving pt any po intake until pt is FULLY awake/alert and talkative/following commands appropriately -- like she presents at home w/ Caregiver. Pt is at increased risk for aspiration if taking po's in setting of lethargy. D/w MD and NSG.  ST services will f/u at lunch meal. NSG agreed.     Comer Portugal, MS, CCC-SLP Speech Language Pathologist Rehab Services; [facility] [phone] (ascom) Niles Ess 04/13/2024, 8:30 AM

## 2024-04-13 NOTE — Plan of Care (Signed)
  Problem: Education: Goal: Knowledge of General Education information will improve Description: Including pain rating scale, medication(s)/side effects and non-pharmacologic comfort measures Outcome: Progressing   Problem: Health Behavior/Discharge Planning: Goal: Ability to manage health-related needs will improve Outcome: Progressing   Problem: Clinical Measurements: Goal: Ability to maintain clinical measurements within normal limits will improve Outcome: Progressing   Problem: Elimination: Goal: Will not experience complications related to urinary retention Outcome: Progressing   Problem: Clinical Measurements: Goal: Postoperative complications will be avoided or minimized Outcome: Progressing   Problem: Pain Management: Goal: Pain level will decrease Outcome: Progressing

## 2024-04-13 NOTE — Progress Notes (Signed)
 Physical Therapy Treatment Patient Details Name: Claudia Friedman MRN: [id number] DOB: [birth year] Today's Date: 04/13/2024   History of Present Illness Pt is a 89 y.o. female presented to hospital s/p ground level fall sustaining right intertrochanteric fracture s/p R hip rod on 11/9. 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    PT Comments  Pt was found on 3L of O2 [place] on arrival with family member at bedside. Pt was pleasant and slightly unmotivated to participate during the session due to pain, but put forth good effort throughout. Pt was a little warm at the start of the session and nursing staff was notified --temperature was normal. Pt was able to perform bed mobility (minA), standing balance with RW and needed additional cueing for hand placement. Pt tolerated for 20 sec with feet flat and ModA. She was able to step backwards (2 steps) with RW and mod A to sit EOB. Pt was transferred to chair using Camie Ip. Pt needed cueing for hand placement and physical assist for foot placement prior to transfer. Pt reported no adverse symptoms, other than R LE pain, during the session with SpO2 and HR WNL throughout on RA. Pt was educated on benefits of sitting upright to alleviate pain in R LE. Pt did not make huge progress today due to pain, but will benefit from continued PT services upon discharge to safely address deficits listed in patient problem list for decreased caregiver assistance and eventual return to PLOF.    If plan is discharge home, recommend the following: A little help with walking and/or transfers;A little help with bathing/dressing/bathroom;Assistance with cooking/housework;Assist for transportation;Help with stairs or ramp for entrance   Can travel by private vehicle     No  Equipment Recommendations       Recommendations for  Other Services       Precautions / Restrictions Precautions Precautions: Fall Recall of Precautions/Restrictions: Intact Restrictions Weight Bearing Restrictions Per Provider Order: Yes RLE Weight Bearing Per Provider Order: Weight bearing as tolerated     Mobility  Bed Mobility Overal bed mobility: Needs Assistance Bed Mobility: Supine to Sit     Supine to sit: +2 Mod assist, Used rails, HOB elevated Sit to supine: +2 Mod  assist   General bed mobility comments: LLE assist for supine<>sit at EOB - was able to push through UE for support    Transfers Overall transfer level: Needs assistance Equipment used: Rolling walker (2 wheels) Transfers: Sit to/from Stand, Bed to chair/wheelchair/BSC Sit to Stand: From elevated surface, +2 physical assistance, Mod assist           General transfer comment: Pt was able to stand EOB with RW and minA. Extra cueing was needed for hand placement and push through with UEs. Pt was able to fully flatten foot on the floor and accept weight to walk backwards toward the bed with cueing. Stood for a bout 20 seconds. Pt was able to transfer to chair using sara stedy and mod A +2 physical assist. Transfer via Lift Equipment: Stedy  Ambulation/Gait : 2 steps backwards towards bed with RW and modA                    Stairs             Wheelchair Mobility     Tilt Bed    Modified Rankin (Stroke Patients Only)  Balance Overall balance assessment: Needs assistance Sitting-balance support: No upper extremity supported Sitting balance-Leahy Scale: Good   Postural control: Posterior lean Standing balance support: Single extremity supported Standing balance-Leahy Scale: Fair Standing balance comment: stood for approx 20 seconds with RW, and was able to to take steps back towards bed to sit EOB                            Communication Communication Communication: Impaired Factors Affecting Communication:  Hearing impaired  Cognition Arousal: Alert Behavior During Therapy: Anxious   PT - Cognitive impairments: Difficult to assess Difficult to assess due to: Hard of hearing/deaf, Impaired communication                     PT - Cognition Comments: Pt needed more time for processing Following commands: Impaired Following commands impaired: Follows one step commands with increased time    Cueing Cueing Techniques: Verbal cues, Gestural cues, Tactile cues, Visual cues  Exercises Other Exercises Other Exercises: education on the benefits of sitting upright in chair    General Comments        Pertinent Vitals/Pain Pain Assessment Pain Assessment: Faces Faces Pain Scale: Hurts a little bit Breathing: normal Negative Vocalization: occasional moan/groan, low speech, negative/disapproving quality Facial Expression: sad, frightened, frown Body Language: tense, distressed pacing, fidgeting Consolability: distracted or reassured by voice/touch PAINAD Score: 4 Pain Location: R hip Pain Descriptors / Indicators: Sore, Discomfort Pain Intervention(s): Monitored during session, Limited activity within patient's tolerance, Repositioned    Home Living                          Prior Function            PT Goals (current goals can now be found in the care plan section) Acute Rehab PT Goals Patient Stated Goal: getting back to life and doing ADLs PT Goal Formulation: With patient Time For Goal Achievement: 04/25/24 Potential to Achieve Goals: Fair Progress towards PT goals: Not progressing toward goals - comment (limited by R hip pain and motivation)    Frequency    7X/week      PT Plan      Co-evaluation              AM-PAC PT 6 Clicks Mobility   Outcome Measure  Help needed turning from your back to your side while in a flat bed without using bedrails?: A Little Help needed moving from lying on your back to sitting on the side of a flat bed without  using bedrails?: A Little Help needed moving to and from a bed to a chair (including a wheelchair)?: A Lot Help needed standing up from a chair using your arms (e.g., wheelchair or bedside chair)?: A Lot Help needed to walk in hospital room?: A Lot Help needed climbing 3-5 steps with a railing? : Total 6 Click Score: 13    End of Session Equipment Utilized During Treatment: Gait belt Activity Tolerance: Patient limited by pain Patient left: in chair;with call bell/phone within reach;with chair alarm set Nurse Communication: Mobility status PT Visit Diagnosis: Other abnormalities of gait and mobility (R26.89);Muscle weakness (generalized) (M62.81);Unsteadiness on feet (R26.81);History of falling (Z91.81);Pain Pain - Right/Left: Right Pain - part of body: Hip     Time: 8578-8551 PT Time Calculation (min) (ACUTE ONLY): 27 min  Charges:  Corean Newport, SPT 04/13/24, 4:15 PM

## 2024-04-13 NOTE — [place] FL2 (Signed)
 [place]  MEDICAID FL2 LEVEL OF CARE FORM     IDENTIFICATION  Patient Name: Claudia Friedman Birthdate: [birth year] Sex: female Admission Date (Current Location): 04/10/2024  [facility] [place] Number:  Chiropodist and Address:  [facility], [address], [place], [place] [address]      Provider Number: [id number]  Attending Physician Name and Address:  Cox, Greig SAILOR, DO  Relative Name and Phone Number:       Current Level of Care: Hospital Recommended Level of Care: Skilled Nursing Facility Prior Approval Number:    Date Approved/Denied:   PASRR Number:    Discharge Plan: Home    Current Diagnoses: Patient Active Problem List   Diagnosis Date Noted   Protein-calorie malnutrition, severe 04/12/2024   Squamous cell carcinoma in situ 04/12/2024   Hip fracture due to osteoporosis (HCC) 04/11/2024   GERD (gastroesophageal reflux disease) 04/11/2024   Depression 04/11/2024   Acute blood loss anemia 10/07/2020   Chest pain with moderate risk for cardiac etiology 12/19/2015   Uncontrolled hypertension 12/19/2015   Anxiety 12/19/2015    Orientation RESPIRATION BLADDER Height & Weight     Self  O2 (Nasal Cannula 3 L) Continent Weight: 131 lb 6.4 oz (59.6 kg) Height:  5' 6 (167.6 cm)  BEHAVIORAL SYMPTOMS/MOOD NEUROLOGICAL BOWEL NUTRITION STATUS      Incontinent Diet (Regular)  AMBULATORY STATUS COMMUNICATION OF NEEDS Skin   Limited Assist Verbally Surgical wounds (Sugical Closed Incision right hip)                       Personal Care Assistance Level of Assistance  Bathing, Feeding, Dressing Bathing Assistance: Limited assistance Feeding assistance: Independent Dressing Assistance: Limited assistance     Functional Limitations Info  Sight, Hearing, Speech Sight Info: Adequate Hearing Info: Adequate Speech Info: Adequate    SPECIAL CARE FACTORS FREQUENCY  PT (By licensed PT), OT (By licensed OT)     PT Frequency:  5x/week OT Frequency: 5x/week            Contractures      Additional Factors Info  Code Status, Allergies Code Status Info: Full Allergies Info: NKA           Current Medications (04/13/2024):  This is the current hospital active medication list Current Facility-Administered Medications  Medication Dose Route Frequency Provider Last Rate Last Admin   acetaminophen  (TYLENOL ) tablet 650 mg  650 mg Oral Q6H PRN Aberman, Zachary, MD   650 mg at 04/13/24 1424   docusate sodium (COLACE) capsule 100 mg  100 mg Oral BID Aberman, Zachary, MD   100 mg at 04/13/24 1150   enoxaparin  (LOVENOX ) injection 40 mg  40 mg Subcutaneous Q24H Aberman, Zachary, MD   40 mg at 04/13/24 1150   escitalopram  (LEXAPRO ) tablet 5 mg  5 mg Oral Daily Acheampong, Peter K, MD   5 mg at 04/13/24 1151   Fe Fum-Vit C-Vit B12-FA (TRIGELS-F FORTE) capsule 1 capsule  1 capsule Oral QPC breakfast Gaines, Thomas C, PA-C       feeding supplement (ENSURE PLUS HIGH PROTEIN) liquid 237 mL  237 mL Oral BID BM Cox, Amy N, DO   237 mL at 04/13/24 1000   haloperidol lactate (HALDOL) injection 1 mg  1 mg Intravenous Q6H PRN Duncan, Hazel V, MD   1 mg at 04/12/24 2049   hydrALAZINE (APRESOLINE) injection 5 mg  5 mg Intravenous Q6H PRN Cox, Amy N, DO  HYDROcodone-acetaminophen  (NORCO/VICODIN) 5-325 MG per tablet 1 tablet  1 tablet Oral Q6H PRN Cox, Amy N, DO       melatonin tablet 5 mg  5 mg Oral QHS PRN Cox, Amy N, DO   5 mg at 04/11/24 2008   menthol (CEPACOL) lozenge 3 mg  1 lozenge Oral PRN Aberman, Zachary, MD       Or   phenol (CHLORASEPTIC) mouth spray 1 spray  1 spray Mouth/Throat PRN Aberman, Zachary, MD       metoCLOPramide (REGLAN) tablet 5-10 mg  5-10 mg Oral Q8H PRN Aberman, Zachary, MD       Or   metoCLOPramide (REGLAN) injection 5-10 mg  5-10 mg Intravenous Q8H PRN Aberman, Zachary, MD       morphine  (PF) 2 MG/ML injection 1 mg  1 mg Intravenous Q2H PRN Cox, Amy N, DO       ondansetron  (ZOFRAN ) tablet 4 mg  4  mg Oral Q6H PRN Aberman, Zachary, MD       Or   ondansetron  (ZOFRAN ) injection 4 mg  4 mg Intravenous Q6H PRN Aberman, Zachary, MD       pantoprazole  (PROTONIX ) EC tablet 40 mg  40 mg Oral Daily Acheampong, Peter K, MD   40 mg at 04/13/24 1151   senna-docusate (Senokot-S) tablet 1 tablet  1 tablet Oral QHS PRN Acheampong, Peter K, MD       traMADol DANNY) tablet 50 mg  50 mg Oral Q6H PRN Aberman, Zachary, MD   50 mg at 04/13/24 1329   traZODone  (DESYREL ) tablet 50 mg  50 mg Oral QHS Duncan, Hazel V, MD   50 mg at 04/12/24 2048     Discharge Medications: Please see discharge summary for a list of discharge medications.  Relevant Imaging Results:  Relevant Lab Results:   Additional Information SSN: [id number]  Claudia Friedman  [place], LCSW

## 2024-04-13 NOTE — Progress Notes (Addendum)
   Subjective: 2 Days Post-Op Procedure(s) (LRB): FIXATION, FRACTURE, INTERTROCHANTERIC, WITH INTRAMEDULLARY ROD (Right) Patient sleeping this am. Caretaker at bedside. Agitated last night given haldol. Patient is well, and has had no acute complaints or problems Denies any CP, SOB, ABD pain. We will continue with physical therapy today.   Objective: Vital signs in last 24 hours: Temp:  [97.5 F (36.4 C)-99.5 F (37.5 C)] 98.4 F (36.9 C) (11/11 0819) Pulse Rate:  [61-82] 61 (11/11 0819) Resp:  [16-18] 16 (11/11 0819) BP: (98-125)/(44-57) 102/44 (11/11 0819) SpO2:  [91 %-100 %] 91 % (11/11 0819)  Intake/Output from previous day: 11/10 0701 - 11/11 0700 In: 560 [P.O.:560] Out: 380 [Urine:380] Intake/Output this shift: No intake/output data recorded.  Recent Labs    04/10/24 2202 04/12/24 0740 04/13/24 0552  HGB 10.9* 8.8* 8.5*   Recent Labs    04/12/24 0740 04/13/24 0552  WBC 11.2* 15.1*  RBC 2.76* 2.68*  HCT 26.9* 26.1*  PLT 136* 131*   Recent Labs    04/12/24 0740 04/13/24 0552  NA 138 138  K 4.4 4.1  CL 102 104  CO2 26 24  BUN 25* 26*  CREATININE 1.02* 0.97  GLUCOSE 96 90  CALCIUM 8.1* 7.9*   Recent Labs    04/10/24 2020  INR 1.1    EXAM General - Patient is sleeping Dressing - dressing C/D/I and no drainage. Minimal swelling. No bruising. Compartments soft.    Past Medical History:  Diagnosis Date   Anxiety    History of echocardiogram    a. 12/19/15: EF 60-65%, no RWMA, nl LV diastolic fxn, mild AI, LA nl, PASP nl   History of nuclear stress test    a. 12/19/15: no evidence of ischemia, EF 76%, low risk study   Hypertension     Assessment/Plan:   2 Days Post-Op Procedure(s) (LRB): FIXATION, FRACTURE, INTERTROCHANTERIC, WITH INTRAMEDULLARY ROD (Right) Principal Problem:   Hip fracture due to osteoporosis (HCC) Active Problems:   Anxiety   GERD (gastroesophageal reflux disease)   Depression   Protein-calorie malnutrition, severe    Squamous cell carcinoma in situ  Estimated body mass index is 21.21 kg/m as calculated from the following:   Height as of this encounter: 5' 6 (1.676 m).   Weight as of this encounter: 59.6 kg. Advance diet Up with therapy Pain well controlled VSS Limit narcotics, tylenol  prn pain. Avoid sedating meds Acute post op blood loss anemia - Hgb 8.5. continue with Fe supplement. Recheck Hgb in the am CM to assist with discharge to home vs SNF pending progress with PT   Patient will need 2-week follow-up with [facility] Lovenox  40 mg subcu daily x 14 days at discharge. Patient can begin showering 3 days postop with honeycomb dressing.  At 7 days postop patient can remove honeycomb dressings and continue showering with no dressing.  DVT Prophylaxis - Lovenox , TED hose, and SCDs Weight-Bearing as tolerated to right leg   T. Medford Amber, PA-C [facility] 04/13/2024, 8:23 AM

## 2024-04-13 NOTE — Progress Notes (Signed)
 PROGRESS NOTE  Claudia Friedman  FMW:[id number] DOB: [birth year] DOA: 04/10/2024 PCP: Alla Amis, MD   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  Serum sodium is 138, potassium 3.5, chloride 100, bicarb 23, BUN of 24, serum creatinine 1.09, eGFR 48, nonfasting glucose 112, WBC 23, hemoglobin 10.9, platelets of 197.  COVID/influenza A/influenza B/RSV PCR were negative.  Right hip x-ray: Acute right femoral intertrochanteric fracture, mildly comminuted and impacted.  11/9: Patient admitted to hospitalist service for right intertrochanteric hip fracture.  11/9: Patient taken to the OR on 11/9 for fixation, of intratrochanteric with intramedullary rod of the right lower extremity.  On my evaluation, vitals showed 97.6, respiratory rate 17, heart rate 53, blood pressure 91/59, SpO2 100% on nasal cannula.  11/10: day 2 hospitalization and post op day 1.  Patient reports her area of squamous cell carcinoma mass on her neck and under her jaw have been bothering her for several months.  She reports that she has had surgery and has been evaluated by cancer doctors. SLP consulted.  11/11: day 3 of hospitalization and post op day 2.  Patient continues to endorse difficulty with swallowing.  SLP interventions her dietitian has been consulted.  Patient with extensive history of squamous cell carcinoma in the submandibular/neck.  Palliative care has been consulted.  High risk of failure to thrive.  PT, OT, SNF.  Assessment & Plan:   Principal Problem:   Hip fracture due to osteoporosis [facility]) Active Problems:   Anxiety   Squamous cell carcinoma in situ   GERD (gastroesophageal reflux disease)   Depression   Protein-calorie malnutrition, severe   Assessment and Plan:  * Hip fracture due to  osteoporosis (HCC) Keep n.p.o. Patient will go to OR today with orthopedic service Symptomatic pain support: Hydrocodone-acetaminophen  5-325 mg every 6 hours as needed for moderate pain, 3 days ordered; morphine  2 mg IV every 4 hours as needed for severe pain, 1 day ordered  Squamous cell carcinoma in situ Of the left maxillary gingiva/retromolar trigone status post dental extraction, alveolectomy, segmental mandibulectomy, oral cavity tumor resection, left neck dissection, tracheostomy, left osteocutaneous radial forearm free flap reconstruction. Aspiration precaution due to high aspiration risk SLP consulted  Anxiety Alprazolam  0.25 mg p.o. twice daily as needed for anxiety  Protein-calorie malnutrition, severe Registered dietitian has been consulted  Depression Escitalopram  5 mg daily resume  GERD (gastroesophageal reflux disease) Home PPI  DVT prophylaxis: Enoxaparin  Code Status: Full code Family Communication: Updated son, Claudia Friedman at bedside Disposition Plan: Pending clinical course; poor prognosis Level of care: Med-Surg  Consultants:  Orthopedic service, PT, OT, palliative, registered dietitian  Procedures:  Status post intratrochanteric fixation with intramedullary  Antimicrobials: None indicated at this time  Subjective:  At bedside, patient awake alert and oriented x 3.  She appears depressed at times tearful.  She reports trouble with eating.  She reports she is so weak and tired all the time.  Objective: Vitals:   04/12/24 1714 04/12/24 2005 04/13/24 0555 04/13/24 0819  BP: (!) 125/54 (!) 121/57 (!) 111/54 (!) 102/44  Pulse: 78 81 82 61  Resp: 18 17 18 16   Temp: (!) 97.5 F (36.4 C) 98.4 F (36.9 C) 99.5 F (37.5 C) 98.4 F (36.9 C)  TempSrc:      SpO2: 93% 91% 100% 91%  Weight:  Height:        Intake/Output Summary (Last 24 hours) at 04/13/2024 1503 Last data filed at 04/13/2024 1427 Gross per 24 hour  Intake 180 ml  Output 100 ml  Net 80  ml   Filed Weights   04/10/24 1902  Weight: 59.6 kg   Examination:  General exam: Appears frail, cachectic HEENT: Normocephalic, external ears are within normal limits.  Trachea is midline.  Poor dentition with multiple teeth loss and extraction. Respiratory system: Clear to auscultation. Respiratory effort normal. Cardiovascular system: S1 & S2 heard, RRR. No JVD, murmurs, rubs, gallops or clicks. No pedal edema. Gastrointestinal system: Abdomen is nondistended, soft and nontender. No organomegaly or masses felt. Normal bowel sounds heard. Central nervous system: Alert and oriented. No focal neurological deficits. Extremities: Decreased strength and range of motion at the right lower extremity. Skin: No rashes, lesions or ulcers Psychiatry: Judgement and insight appear normal. Depressed Mood & affect appropriate.   Data Reviewed: I have personally reviewed following labs and imaging studies  CBC: Recent Labs  Lab 04/10/24 2202 04/12/24 0740 04/13/24 0552  WBC 23.0* 11.2* 15.1*  HGB 10.9* 8.8* 8.5*  HCT 33.2* 26.9* 26.1*  MCV 96.0 97.5 97.4  PLT 197 136* 131*   Basic Metabolic Panel: Recent Labs  Lab 04/10/24 2202 04/12/24 0740 04/13/24 0552  NA 138 138 138  K 3.5 4.4 4.1  CL 100 102 104  CO2 23 26 24   GLUCOSE 112* 96 90  BUN 24* 25* 26*  CREATININE 1.09* 1.02* 0.97  CALCIUM 8.2* 8.1* 7.9*   GFR: Estimated Creatinine Clearance: 35.4 mL/min (by C-G formula based on SCr of 0.97 mg/dL).  Coagulation Profile: Recent Labs  Lab 04/10/24 2020  INR 1.1   Recent Results (from the past 240 hours)  Resp panel by RT-PCR (RSV, Flu A&B, Covid) Anterior Nasal Swab     Status: None   Collection Time: 04/10/24 11:16 PM   Specimen: Anterior Nasal Swab  Result Value Ref Range Status   SARS Coronavirus 2 by RT PCR NEGATIVE NEGATIVE Final    Comment: (NOTE) SARS-CoV-2 target nucleic acids are NOT DETECTED.  The SARS-CoV-2 RNA is generally detectable in upper  respiratory specimens during the acute phase of infection. The lowest concentration of SARS-CoV-2 viral copies this assay can detect is 138 copies/mL. A negative result does not preclude SARS-Cov-2 infection and should not be used as the sole basis for treatment or other patient management decisions. A negative result may occur with  improper specimen collection/handling, submission of specimen other than nasopharyngeal swab, presence of viral mutation(s) within the areas targeted by this assay, and inadequate number of viral copies(<138 copies/mL). A negative result must be combined with clinical observations, patient history, and epidemiological information. The expected result is Negative.  Fact Sheet for Patients:  bloggercourse.com  Fact Sheet for Healthcare Providers:  seriousbroker.it  This test is no t yet approved or cleared by the [place]  FDA and  has been authorized for detection and/or diagnosis of SARS-CoV-2 by FDA under an Emergency Use Authorization (EUA). This EUA will remain  in effect (meaning this test can be used) for the duration of the COVID-19 declaration under Section 564(b)(1) of the Act, 21 U.S.C.section 360bbb-3(b)(1), unless the authorization is terminated  or revoked sooner.       Influenza A by PCR NEGATIVE NEGATIVE Final   Influenza B by PCR NEGATIVE NEGATIVE Final    Comment: (NOTE) The Xpert Xpress SARS-CoV-2/FLU/RSV plus assay is intended as an aid  in the diagnosis of influenza from Nasopharyngeal swab specimens and should not be used as a sole basis for treatment. Nasal washings and aspirates are unacceptable for Xpert Xpress SARS-CoV-2/FLU/RSV testing.  Fact Sheet for Patients: bloggercourse.com  Fact Sheet for Healthcare Providers: seriousbroker.it  This test is not yet approved or cleared by the [place]  FDA and has been  authorized for detection and/or diagnosis of SARS-CoV-2 by FDA under an Emergency Use Authorization (EUA). This EUA will remain in effect (meaning this test can be used) for the duration of the COVID-19 declaration under Section 564(b)(1) of the Act, 21 U.S.C. section 360bbb-3(b)(1), unless the authorization is terminated or revoked.     Resp Syncytial Virus by PCR NEGATIVE NEGATIVE Final    Comment: (NOTE) Fact Sheet for Patients: bloggercourse.com  Fact Sheet for Healthcare Providers: seriousbroker.it  This test is not yet approved or cleared by the [place]  FDA and has been authorized for detection and/or diagnosis of SARS-CoV-2 by FDA under an Emergency Use Authorization (EUA). This EUA will remain in effect (meaning this test can be used) for the duration of the COVID-19 declaration under Section 564(b)(1) of the Act, 21 U.S.C. section 360bbb-3(b)(1), unless the authorization is terminated or revoked.  Performed at [facility], [address], [place], [place] [address]     Radiology Studies: No results found.  Scheduled Meds:  docusate sodium  100 mg Oral BID   enoxaparin  (LOVENOX ) injection  40 mg Subcutaneous Q24H   escitalopram   5 mg Oral Daily   Fe Fum-Vit C-Vit B12-FA  1 capsule Oral QPC breakfast   feeding supplement  237 mL Oral BID BM   pantoprazole   40 mg Oral Daily   traZODone   50 mg Oral QHS    LOS: 2 days   Time spent: 35 minutes  Dr. Sherre [facility] If 7PM-7AM, please contact night-coverage 04/13/2024, 3:03 PM

## 2024-04-13 NOTE — TOC Initial Note (Signed)
 Transition of Care [facility]) - Initial/Assessment Note    Patient Details  Name: Claudia Friedman MRN: [id number] Date of Birth: 05-15-1931  Transition of Care [facility]) CM/SW Contact:    Alvaro Louder, LCSW Phone Number: 04/13/2024, 4:19 PM  Clinical Narrative:       Per chart review patient is from home. PCP is Alla Amis LCSWA faxed out patient to SNF's in [place]. TOC to present facilities to family.   TOC to follow for discharge        Patient Goals and CMS Choice            Expected Discharge Plan and Services                                              Prior Living Arrangements/Services                       Activities of Daily Living   ADL Screening (condition at time of admission) Independently performs ADLs?: Yes (appropriate for developmental age) Is the patient deaf or have difficulty hearing?: No Does the patient have difficulty seeing, even when wearing glasses/contacts?: No Does the patient have difficulty concentrating, remembering, or making decisions?: No  Permission Sought/Granted                  Emotional Assessment              Admission diagnosis:  Hip fracture due to osteoporosis [facility]) [M80.059A] Patient Active Problem List   Diagnosis Date Noted   Protein-calorie malnutrition, severe 04/12/2024   Squamous cell carcinoma in situ 04/12/2024   Hip fracture due to osteoporosis (HCC) 04/11/2024   GERD (gastroesophageal reflux disease) 04/11/2024   Depression 04/11/2024   Acute blood loss anemia 10/07/2020   Chest pain with moderate risk for cardiac etiology 12/19/2015   Uncontrolled hypertension 12/19/2015   Anxiety 12/19/2015   PCP:  Alla Amis, MD Pharmacy:   [facility] [phone] [address], [place] - [address] [place] [address] Phone: [phone] Fax: [phone]     Social Drivers of Health (SDOH) Social History: SDOH Screenings   Food Insecurity: No Food  Insecurity (04/11/2024)  Housing: Unknown (04/11/2024)  Transportation Needs: No Transportation Needs (04/11/2024)  Utilities: Not At Risk (04/11/2024)  Financial Resource Strain: Low Risk  (04/08/2023)   Received from [facility]  Social Connections: Moderately Isolated (04/11/2024)  Tobacco Use: Low Risk  (04/11/2024)   SDOH Interventions:     Readmission Risk Interventions     No data to display

## 2024-04-14 DIAGNOSIS — E43 Unspecified severe protein-calorie malnutrition: Secondary | ICD-10-CM | POA: Diagnosis not present

## 2024-04-14 DIAGNOSIS — S0181XA Laceration without foreign body of other part of head, initial encounter: Secondary | ICD-10-CM | POA: Diagnosis not present

## 2024-04-14 DIAGNOSIS — K219 Gastro-esophageal reflux disease without esophagitis: Secondary | ICD-10-CM

## 2024-04-14 DIAGNOSIS — M80051A Age-related osteoporosis with current pathological fracture, right femur, initial encounter for fracture: Secondary | ICD-10-CM | POA: Diagnosis not present

## 2024-04-14 LAB — CBC
HCT: 25.9 % — ABNORMAL LOW (ref 36.0–46.0)
Hemoglobin: 8.3 g/dL — ABNORMAL LOW (ref 12.0–15.0)
MCH: 31.6 pg (ref 26.0–34.0)
MCHC: 32 g/dL (ref 30.0–36.0)
MCV: 98.5 fL (ref 80.0–100.0)
Platelets: 163 K/uL (ref 150–400)
RBC: 2.63 MIL/uL — ABNORMAL LOW (ref 3.87–5.11)
RDW: 14.3 % (ref 11.5–15.5)
WBC: 13.6 K/uL — ABNORMAL HIGH (ref 4.0–10.5)
nRBC: 0 % (ref 0.0–0.2)

## 2024-04-14 LAB — BASIC METABOLIC PANEL WITH GFR
Anion gap: 8 (ref 5–15)
BUN: 24 mg/dL — ABNORMAL HIGH (ref 8–23)
CO2: 25 mmol/L (ref 22–32)
Calcium: 8.1 mg/dL — ABNORMAL LOW (ref 8.9–10.3)
Chloride: 107 mmol/L (ref 98–111)
Creatinine, Ser: 0.91 mg/dL (ref 0.44–1.00)
GFR, Estimated: 60 mL/min — ABNORMAL LOW (ref >60)
Glucose, Bld: 90 mg/dL (ref 70–99)
Potassium: 4.7 mmol/L (ref 3.5–5.1)
Sodium: 140 mmol/L (ref 135–145)

## 2024-04-14 MED ORDER — LOPERAMIDE HCL 2 MG PO CAPS
2.0000 mg | ORAL_CAPSULE | Freq: Four times a day (QID) | ORAL | Status: DC | PRN
Start: 2024-04-14 — End: 2024-04-15
  Administered 2024-04-14 – 2024-04-15 (×2): 2 mg via ORAL
  Filled 2024-04-14 (×2): qty 1

## 2024-04-14 NOTE — Progress Notes (Signed)
 Occupational Therapy Treatment Patient Details Name: Claudia Friedman MRN: [id number] DOB: [birth year] Today's Date: 04/14/2024   History of present illness Pt is a 89 y.o. female presented to hospital s/p ground level fall sustaining right intertrochanteric fracture s/p R hip rod on 11/9. 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   OT comments  Patient seen for OT/PT treatment on this date; patient requires A x 2 to progress mobility prior to this tx. Upon arrival to room patient resting in bed with her private aid from home present, patient A&O x 4 agreeable to treatment. During bed mobility, noted that patient had begun to have BM, patient agreeable to transfer to [facility]; patient required mod A to roll to L to A with RLE and to lift trunk; from EOB patient performed SPT to [facility] without RW (due to urgency) with mod A x 1. Patient required total A  for pericare/incontinence care while in standing with min A. Patient ambulated 10 feet in room with min A x 2 with r/w, cues for stride length, upper body posture and breathing techniques with good tolerance. Patient transferred to recliner with min A and cues for hand placement. At end of tx, patient reports needing to have another BM, SPT from recliner to [facility] with mod A x 1. O2 checked throughout, patient poor perfusion, however respiratory rate did not increase, occasionally would read 95% and higher.  Patient ended treatment on [facility] with RN and aid present.  Patient making good progress toward goals, will continue to follow POC. Discharge recommendation remains appropriate.        If plan is discharge home, recommend the following:  A lot of help with walking and/or transfers;A lot of help with bathing/dressing/bathroom;Assistance with cooking/housework;Assist for transportation;Help with stairs or ramp for  entrance;Direct supervision/assist for medications management;Direct supervision/assist for financial management;Supervision due to cognitive status   Equipment Recommendations  Other (comment) (defer to next venue of care)    Recommendations for Other Services      Precautions / Restrictions Precautions Precautions: Fall Recall of Precautions/Restrictions: Intact Restrictions Weight Bearing Restrictions Per Provider Order: Yes RUE Weight Bearing Per Provider Order: Weight bearing as tolerated RLE Weight Bearing Per Provider Order: Weight bearing as tolerated       Mobility Bed Mobility Overal bed mobility: Needs Assistance Bed Mobility: Supine to Sit     Supine to sit: Mod assist          Transfers Overall transfer level: Needs assistance Equipment used: Rolling walker (2 wheels) Transfers: Sit to/from Stand Sit to Stand: Min assist           General transfer comment: patient performed sit<>stand from [facility] for mobility with min A for lifting     Balance Overall balance assessment: Needs assistance Sitting-balance support: No upper extremity supported Sitting balance-Leahy Scale: Good     Standing balance support: During functional activity Standing balance-Leahy Scale: Fair Standing balance comment: reliant on r/w due to pain in R hip                           ADL either performed or assessed with clinical judgement   ADL Overall ADL's : Needs assistance/impaired                         Toilet Transfer: Moderate assistance;+2 for  safety/equipment;Rolling walker (2 wheels) Toilet Transfer Details (indicate cue type and reason): SPT from EOB to [facility] without walker due to urgency, SPT from recliner to [facility] without walker due to urgency, mod A x 1 Toileting- Clothing Manipulation and Hygiene: Maximal assistance;Sit to/from stand Toileting - Clothing Manipulation Details (indicate cue type and reason): total A for thorough pericare s/p  incontinent bowel movement       General ADL Comments: incontinent of bowels are start of tx, needed to have another BM at end of tx    Extremity/Trunk Assessment Upper Extremity Assessment Upper Extremity Assessment: Overall WFL for tasks assessed            Vision       Perception     Praxis     Communication Communication Communication: Impaired Factors Affecting Communication: Reduced clarity of speech   Cognition Arousal: Alert Behavior During Therapy: WFL for tasks assessed/performed Cognition: No apparent impairments                               Following commands: Intact Following commands impaired: Follows one step commands with increased time      Cueing   Cueing Techniques: Verbal cues  Exercises Exercises: Other exercises Other Exercises Other Exercises: ambulated 10 feet with r/w with min A x 2 for safety, increased weight bearing thru BUE to off load R hip, reported pain 8/10 in R hip during ambulation    Shoulder Instructions       General Comments      Pertinent Vitals/ Pain       Pain Assessment Pain Assessment: 0-10 Pain Score: 8  Pain Location: R hip Pain Descriptors / Indicators: Sore, Discomfort Pain Intervention(s): Limited activity within patient's tolerance, Monitored during session  Home Living                                          Prior Functioning/Environment              Frequency  Min 2X/week        Progress Toward Goals  OT Goals(current goals can now be found in the care plan section)  Progress towards OT goals: Progressing toward goals  Acute Rehab OT Goals Patient Stated Goal: to go home OT Goal Formulation: With patient Time For Goal Achievement: 04/26/24 Potential to Achieve Goals: Fair ADL Goals Pt Will Perform Lower Body Bathing: with min assist;sit to/from stand;sitting/lateral leans Pt Will Perform Lower Body Dressing: with min assist;sit to/from  stand;sitting/lateral leans Pt Will Transfer to Toilet: stand pivot transfer;ambulating;with contact guard assist  Plan      Co-evaluation    PT/OT/SLP Co-Evaluation/Treatment: Yes Reason for Co-Treatment: To address functional/ADL transfers;Complexity of the patient's impairments (multi-system involvement) PT goals addressed during session: Mobility/safety with mobility;Proper use of DME OT goals addressed during session: ADL's and self-care      AM-PAC OT 6 Clicks Daily Activity     Outcome Measure   Help from another person eating meals?: A Little Help from another person taking care of personal grooming?: A Little Help from another person toileting, which includes using toliet, bedpan, or urinal?: A Lot Help from another person bathing (including washing, rinsing, drying)?: A Lot Help from another person to put on and taking off regular upper body clothing?: A Lot Help from another person to put  on and taking off regular lower body clothing?: A Lot 6 Click Score: 14    End of Session Equipment Utilized During Treatment: Rolling walker (2 wheels);Oxygen  OT Visit Diagnosis: Other abnormalities of gait and mobility (R26.89);Muscle weakness (generalized) (M62.81);Pain Pain - Right/Left: Right Pain - part of body: Hip   Activity Tolerance Patient tolerated treatment well   Patient Left Other (comment) (on [facility] with RN and private aid present)   Nurse Communication          Time: [phone] OT Time Calculation (min): 25 min  Charges: OT General Charges $OT Visit: 1 Visit OT Treatments $Self Care/Home Management : 23-37 mins  Rogers Clause, OT/L MSOT, 04/14/2024

## 2024-04-14 NOTE — Discharge Summary (Incomplete)
Physician Discharge Summary   Patient: Claudia Friedman MRN: [id number] DOB: Apr 15, 1931  Admit date:     04/10/2024  Discharge date: {dischdate:26783}  Discharge Physician: Leita Blanch   PCP: Alla Amis, MD   Recommendations at discharge:  {Tip this will not be part of the note when signed- Example include specific recommendations for outpatient follow-up, pending tests to follow-up on. (Optional):26781}  ***  Discharge Diagnoses: Principal Problem:   Hip fracture due to osteoporosis [facility]) Active Problems:   Anxiety   Squamous cell carcinoma in situ   GERD (gastroesophageal reflux disease)   Depression   Protein-calorie malnutrition, severe  Resolved Problems:   * No resolved hospital problems. [facility] Course: 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  Serum sodium is 138, potassium 3.5, chloride 100, bicarb 23, BUN of 24, serum creatinine 1.09, eGFR 48, nonfasting glucose 112, WBC 23, hemoglobin 10.9, platelets of 197.  COVID/influenza A/influenza B/RSV PCR were negative.  Right hip x-ray: Acute right femoral intertrochanteric fracture, mildly comminuted and impacted.  11/9: Patient admitted to hospitalist service for right intertrochanteric hip fracture.  11/9: Patient taken to the OR on 11/9 for fixation, of intratrochanteric with intramedullary rod of the right lower extremity.  On my evaluation, vitals showed 97.6, respiratory rate 17, heart rate 53, blood pressure 91/59, SpO2 100% on nasal cannula.  11/10: day 2 hospitalization and post op day 1.  Patient reports her area of squamous cell carcinoma mass on her neck and under her jaw have been bothering her for several months.  She reports that she has had surgery and has been evaluated by cancer doctors. SLP  consulted.  11/11: day 3 of hospitalization and post op day 2.  Patient continues to endorse difficulty with swallowing.  SLP interventions her dietitian has been consulted.  Patient with extensive history of squamous cell carcinoma in the submandibular/neck.  Palliative care has been consulted.  High risk of failure to thrive.  PT, OT, SNF.  Assessment and Plan: * Hip fracture due to osteoporosis (HCC) Keep n.p.o. Patient will go to OR today with orthopedic service Symptomatic pain support: Hydrocodone-acetaminophen  5-325 mg every 6 hours as needed for moderate pain, 3 days ordered; morphine  2 mg IV every 4 hours as needed for severe pain, 1 day ordered  Squamous cell carcinoma in situ Of the left maxillary gingiva/retromolar trigone status post dental extraction, alveolectomy, segmental mandibulectomy, oral cavity tumor resection, left neck dissection, tracheostomy, left osteocutaneous radial forearm free flap reconstruction. Aspiration precaution due to high aspiration risk SLP consulted  Anxiety Alprazolam  0.25 mg p.o. twice daily as needed for anxiety  Protein-calorie malnutrition, severe Registered dietitian has been consulted  Depression Escitalopram  5 mg daily resume  GERD (gastroesophageal reflux disease) Home PPI      {Tip this will not be part of the note when signed Body mass index is 21.21 kg/m. 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]  Nutrition Goal Patient will meet greater than or equal to 90% of their needs  Interventions Ensure Enlive (each supplement provides 350kcal and 20 grams of protein), Magic cup, MVI  ,  (Optional):26781}  {(NOTE) Pain control PDMP Statment (Optional):26782} Consultants: *** Procedures  performed: ***  Disposition: {Plan; Disposition:26390} Diet  recommendation:  {Diet_Plan:26776} DISCHARGE MEDICATION: Allergies as of 04/14/2024   No Known Allergies   Med Rec must be completed prior to using this [facility]***       Follow-up Information     Charlene Debby BROCKS, PA-C Follow up in 2 week(s).   Specialties: Orthopedic Surgery, Emergency Medicine Contact information: [address] [place] [address] [phone]         Alla Amis, MD. Schedule an appointment as soon as possible for a visit in 1 week(s).   Specialty: Family Medicine Contact information: [address] [facility] [place] [address] [phone]                Discharge Exam: Claudia Friedman   04/10/24 1902  Weight: 59.6 kg   ***  Condition at discharge: {DC Condition:26389}  The results of significant diagnostics from this hospitalization (including imaging, microbiology, ancillary and laboratory) are listed below for reference.   Imaging Studies: DG HIP UNILAT WITH PELVIS 2-3 VIEWS RIGHT Result Date: 04/11/2024 CLINICAL DATA:  Fracture EXAM: DG HIP (WITH OR WITHOUT PELVIS) 2-3V RIGHT COMPARISON:  Radiograph yesterday FINDINGS: Six fluoroscopic spot views of the right hip submitted from the operating room. Femoral intramedullary nail with trans trochanteric and distal locking screw fixation of proximal femur fracture. Fluoroscopy time 1 minutes 15 seconds. Dose 14.51 mGy. IMPRESSION: Fluoroscopic spot views during proximal femur fracture ORIF. Electronically Signed   By: Andrea Gasman M.D.   On: 04/11/2024 11:39   DG C-Arm 1-60 Min-No Report Result Date: 04/11/2024 Fluoroscopy was utilized by the requesting physician.  No radiographic interpretation.   DG Chest Portable 1 View Result Date: 04/10/2024 EXAM: 1 VIEW(S) XRAY OF THE CHEST 04/10/2024 11:25:17 PM COMPARISON: 04/10/2024 CLINICAL HISTORY: Hip fracture, elevated white count FINDINGS: LUNGS AND PLEURA: No focal pulmonary opacity. No pulmonary  edema. No pleural effusion. No pneumothorax. HEART AND MEDIASTINUM: Tortuous aorta with atherosclerosis. No acute abnormality of the cardiac silhouette. BONES AND SOFT TISSUES: No acute osseous abnormality. IMPRESSION: 1. No acute cardiopulmonary process. Electronically signed by: Oneil Devonshire MD 04/10/2024 11:29 PM EST RP Workstation: [id number]   CT Cervical Spine Wo Contrast Result Date: 04/10/2024 EXAM: CT CERVICAL SPINE WITHOUT CONTRAST 04/10/2024 09:47:19 PM TECHNIQUE: CT of the cervical spine was performed without the administration of intravenous contrast. Multiplanar reformatted images are provided for review. Automated exposure control, iterative reconstruction, and/or weight based adjustment of the mA/kV was utilized to reduce the radiation dose to as low as reasonably achievable. COMPARISON: CT neck 04/30/2023. CLINICAL HISTORY: Facial trauma, blunt. FINDINGS: CERVICAL SPINE: BONES AND ALIGNMENT: Cervical lordosis is maintained. Similar appearance of anterolisthesis of C2 on C3. Similar appearance of anterolisthesis of C7 on T1 with slight asymmetric widening of the disc space which is unchanged from prior. No acute fracture or traumatic malalignment. DEGENERATIVE CHANGES: Degenerative endplate osteophytes and severe disc space narrowing at multiple levels which is similar to prior. Prominent partially visualized postsurgical changes in the left neck. Disc osteophyte complexes at multiple levels. There is mild spinal canal stenosis at C3-C4 through C5-C6. Facet arthrosis and uncovertebral hypertrophy at multiple levels. Significant foraminal stenosis at multiple levels throughout the cervical spine. SOFT TISSUES: No prevertebral soft tissue swelling. IMPRESSION: 1. No acute abnormality of the cervical spine related to the facial trauma. 2. Degenerative changes as above. 3. Alignment similar to prior. Electronically signed by: Donnice Mania MD 04/10/2024 10:11 PM EST RP Workstation: [id number]   CT  Maxillofacial  Wo Contrast Result Date: 04/10/2024 EXAM: CT OF THE FACE WITHOUT CONTRAST 04/10/2024 09:47:19 PM TECHNIQUE: CT of the face was performed without the administration of intravenous contrast. Multiplanar reformatted images are provided for review. Automated exposure control, iterative reconstruction, and/or weight based adjustment of the mA/kV was utilized to reduce the radiation dose to as low as reasonably achievable. COMPARISON: None available. CLINICAL HISTORY: Facial trauma, blunt; note, history of left mandibular surgery. FINDINGS: FACIAL BONES: Postsurgical changes of partial resection of the left body of the mandible with osseous graft noted. There is plate and screw fixation of the left aspect of the mandible extending from the angle of the mandible into the paracentral region. The hardware appears intact. Subtle irregularity of the nasal bones which appears new since the 2022 head CT. There is slight lateral apex angulation of the left nasal bone concerning for minimally displaced fractures. The facial bones are otherwise intact. There is no mandibular condyle dislocation. No suspicious bone lesion. ORBITS: Globes are intact. Lenses are normally located. There is right preseptal soft tissue swelling with mild swelling of the eyelids. There is no evidence of postseptal swelling. No acute traumatic injury. No inflammatory change. SINUSES AND MASTOIDS: Mucosal thickening in the left maxillary sinus with thickening of the sinus wall suggestive of mucoperiosteal reaction. Findings are suggestive of chronic left maxillary sinusitis. There is focal chronic dehiscence in the floor of the left maxillary sinus without evidence of soft tissue communication between the oral cavity and the sinus. Small air fluid level in the right maxillary sinus. Recommend correlation with tenderness over the sinus. Additional scattered mucosal thickening in the ethmoid sinuses. Trace mucosal thickening in the sphenoid  sinuses. Concha bullosa of the bilateral middle turbinates. No acute abnormality in the mastoids. SOFT TISSUES: Focal soft tissue swelling over the right zygomatic arch. Changes in the left submandibular space sequelae left sided neck dissection. There is a partially visualized 2.7 x 2.4 cm right submandibular/submental mass involving the subcutaneous tissues in the right anterior neck. This lesion is new compared to the CT neck on 04/30/23. Recommended correlation with nonemergent biopsy. No acute abnormality in other visualized soft tissues. IMPRESSION: 1. Subtle irregularity of the nasal bones with slight lateral apex angulation of the left nasal bone, concerning for minimally displaced fractures, new since the 2022 head CT. 2. Partially visualized 2.7 x 2.4 cm right submandibular/submental mass, new compared to the CT neck on 04/30/23. Recommend nonemergent biopsy. 3. Postsurgical changes of partial resection of the left body of the mandible with osseous graft and plate and screw fixation. 4. Chronic left maxillary sinusitis with focal chronic dehiscence in the floor of the left maxillary sinus without evidence of soft tissue communication between the oral cavity and the sinus. 5. Small air-fluid level in the right maxillary sinus. Electronically signed by: Donnice Mania MD 04/10/2024 10:04 PM EST RP Workstation: [id number]   CT Head Wo Contrast Result Date: 04/10/2024 EXAM: CT HEAD WITHOUT CONTRAST 04/10/2024 09:47:19 PM TECHNIQUE: CT of the head was performed without the administration of intravenous contrast. Automated exposure control, iterative reconstruction, and/or weight based adjustment of the mA/kV was utilized to reduce the radiation dose to as low as reasonably achievable. COMPARISON: CT head 10/07/2020. CLINICAL HISTORY: Facial trauma, blunt. FINDINGS: BRAIN AND VENTRICLES: No acute hemorrhage. No evidence of acute infarct. No hydrocephalus. No extra-axial collection. No mass effect or midline  shift. Mild generalized parenchymal volume loss. Appearance of mild to moderate chronic microvascular ischemic changes. Atherosclerosis of the carotid siphons. ORBITS: No  acute abnormality. SINUSES: Mucosal thickening in the ethmoid and maxillary sinuses. SOFT TISSUES AND SKULL: No acute soft tissue abnormality. No skull fracture. IMPRESSION: 1. No acute intracranial abnormality related to the facial trauma. 2. Mild generalized parenchymal volume loss. 3. Mild to moderate chronic microvascular ischemic changes. Electronically signed by: Donnice Mania MD 04/10/2024 09:54 PM EST RP Workstation: [id number]   CT Hip Right Wo Contrast Result Date: 04/10/2024 EXAM: CT OF THE RIGHT HIP WITHOUT IV CONTRAST 04/10/2024 09:47:19 PM TECHNIQUE: CT of the right hip was performed without the administration of intravenous contrast. Multiplanar reformatted images are provided for review. Automated exposure control, iterative reconstruction, and/or weight based adjustment of the mA/kV was utilized to reduce the radiation dose to as low as reasonably achievable. COMPARISON: None available. CLINICAL HISTORY: Fracture, hip. FINDINGS: BONES: Intratrochanteric right hip fracture, mildly comminuted and impacted, with varus angulation/foreshortening. No aggressive appearing osseous abnormality or periostitis. SOFT TISSUE: Overlying lateral soft tissue hematoma measuring 4.4 x 9.9 cm. No soft tissue mass. JOINT: No significant degenerative changes. No osseous erosions. INTRAPELVIC CONTENTS: Limited images of the intrapelvic contents are unremarkable. IMPRESSION: 1. Intratrochanteric right hip fracture, as above. 2. Overlying lateral soft tissue hematoma. Electronically signed by: Pinkie Pebbles MD 04/10/2024 09:51 PM EST RP Workstation: [id number]   DG Chest 1 View Result Date: 04/10/2024 EXAM: 1 VIEW(S) XRAY OF THE CHEST 04/10/2024 08:17:43 PM COMPARISON: CT chest dated 04/30/2023. CLINICAL HISTORY: Fall. FINDINGS: LUNGS AND PLEURA:  No focal pulmonary opacity. No pulmonary edema. No pleural effusion. No pneumothorax. HEART AND MEDIASTINUM: No acute abnormality of the cardiac and mediastinal silhouettes. BONES AND SOFT TISSUES: No acute osseous abnormality. IMPRESSION: 1. No acute cardiopulmonary process. Electronically signed by: Pinkie Pebbles MD 04/10/2024 08:24 PM EST RP Workstation: [id number]   DG Femur Min 2 Views Right Result Date: 04/10/2024 EXAM: 2 VIEW(S) XRAY OF THE RIGHT FEMUR 04/10/2024 08:17:43 PM COMPARISON: None available. CLINICAL HISTORY: fall, pain mid femur and R hip FINDINGS: BONES AND JOINTS: Acute intertrochanteric fracture of right hip with mild varus angulation. Degenerative changes in right knee. No focal osseous lesion. No joint dislocation. SOFT TISSUES: The soft tissues are unremarkable. IMPRESSION: 1. Acute intertrochanteric fracture of the right hip. Electronically signed by: Pinkie Pebbles MD 04/10/2024 08:23 PM EST RP Workstation: [id number]   DG Hip Unilat W or Wo Pelvis 2-3 Views Right Result Date: 04/10/2024 EXAM: 2 or 3 VIEW(S) XRAY OF THE RIGHT HIP 04/10/2024 08:17:43 PM COMPARISON: None available. CLINICAL HISTORY: fall fall FINDINGS: BONES AND JOINTS: The bones are diffusely osteopenic. There is an acute right femoral intertrochanteric fracture which appears mildly comminuted and impacted. Sclerotic focus in the left femoral head measuring 11 mm, possibly a bone island. There is no dislocation. The hip joint is maintained. No significant degenerative changes. SOFT TISSUES: The soft tissues are unremarkable. IMPRESSION: 1. Acute right femoral intertrochanteric fracture, mildly comminuted and impacted. Electronically signed by: Greig Pique MD 04/10/2024 08:22 PM EST RP Workstation: [id number]    Microbiology: Results for orders placed or performed during the hospital encounter of 04/10/24  Resp panel by RT-PCR (RSV, Flu A&B, Covid) Anterior Nasal Swab     Status: None   Collection Time:  04/10/24 11:16 PM   Specimen: Anterior Nasal Swab  Result Value Ref Range Status   SARS Coronavirus 2 by RT PCR NEGATIVE NEGATIVE Final    Comment: (NOTE) SARS-CoV-2 target nucleic acids are NOT DETECTED.  The SARS-CoV-2 RNA is generally detectable in upper respiratory specimens during the acute phase of infection. The  lowest concentration of SARS-CoV-2 viral copies this assay can detect is 138 copies/mL. A negative result does not preclude SARS-Cov-2 infection and should not be used as the sole basis for treatment or other patient management decisions. A negative result may occur with  improper specimen collection/handling, submission of specimen other than nasopharyngeal swab, presence of viral mutation(s) within the areas targeted by this assay, and inadequate number of viral copies(<138 copies/mL). A negative result must be combined with clinical observations, patient history, and epidemiological information. The expected result is Negative.  Fact Sheet for Patients:  bloggercourse.com  Fact Sheet for Healthcare Providers:  seriousbroker.it  This test is no t yet approved or cleared by the [place]  FDA and  has been authorized for detection and/or diagnosis of SARS-CoV-2 by FDA under an Emergency Use Authorization (EUA). This EUA will remain  in effect (meaning this test can be used) for the duration of the COVID-19 declaration under Section 564(b)(1) of the Act, 21 U.S.C.section 360bbb-3(b)(1), unless the authorization is terminated  or revoked sooner.       Influenza A by PCR NEGATIVE NEGATIVE Final   Influenza B by PCR NEGATIVE NEGATIVE Final    Comment: (NOTE) The Xpert Xpress SARS-CoV-2/FLU/RSV plus assay is intended as an aid in the diagnosis of influenza from Nasopharyngeal swab specimens and should not be used as a sole basis for treatment. Nasal washings and aspirates are unacceptable for Xpert Xpress  SARS-CoV-2/FLU/RSV testing.  Fact Sheet for Patients: bloggercourse.com  Fact Sheet for Healthcare Providers: seriousbroker.it  This test is not yet approved or cleared by the [place]  FDA and has been authorized for detection and/or diagnosis of SARS-CoV-2 by FDA under an Emergency Use Authorization (EUA). This EUA will remain in effect (meaning this test can be used) for the duration of the COVID-19 declaration under Section 564(b)(1) of the Act, 21 U.S.C. section 360bbb-3(b)(1), unless the authorization is terminated or revoked.     Resp Syncytial Virus by PCR NEGATIVE NEGATIVE Final    Comment: (NOTE) Fact Sheet for Patients: bloggercourse.com  Fact Sheet for Healthcare Providers: seriousbroker.it  This test is not yet approved or cleared by the [place]  FDA and has been authorized for detection and/or diagnosis of SARS-CoV-2 by FDA under an Emergency Use Authorization (EUA). This EUA will remain in effect (meaning this test can be used) for the duration of the COVID-19 declaration under Section 564(b)(1) of the Act, 21 U.S.C. section 360bbb-3(b)(1), unless the authorization is terminated or revoked.  Performed at [facility], [address] [place], [place] [address]     Labs: CBC: Recent Labs  Lab 04/10/24 2202 04/12/24 0740 04/13/24 0552 04/14/24 0441  WBC 23.0* 11.2* 15.1* 13.6*  HGB 10.9* 8.8* 8.5* 8.3*  HCT 33.2* 26.9* 26.1* 25.9*  MCV 96.0 97.5 97.4 98.5  PLT 197 136* 131* 163   Basic Metabolic Panel: Recent Labs  Lab 04/10/24 2202 04/12/24 0740 04/13/24 0552 04/14/24 0441  NA 138 138 138 140  K 3.5 4.4 4.1 4.7  CL 100 102 104 107  CO2 23 26 24 25   GLUCOSE 112* 96 90 90  BUN 24* 25* 26* 24*  CREATININE 1.09* 1.02* 0.97 0.91  CALCIUM 8.2* 8.1* 7.9* 8.1*   Liver Function Tests: No results for input(s): AST, ALT,  ALKPHOS, BILITOT, PROT, ALBUMIN in the last 168 hours. CBG: No results for input(s): GLUCAP in the last 168 hours.  Discharge time spent: {LESS THAN/GREATER UYJW:73611} 30 minutes.  Signed: Leita Blanch, MD [facility]  04/14/2024 

## 2024-04-14 NOTE — Discharge Instructions (Addendum)
 Per Orthopedic--Patient can begin showering 3 days postop with honeycomb dressing. At 7 days postop patient can remove honeycomb dressings and continue showering with no dressing.   Per SLP--  Thin;Age appropriate regular (w/ mech soft meats, even minced meats/foods as needed; gravies to moisten) = continue a Regular consistency diet for Choice Options w/ well-Chopped/Cut meats, well-moistened foods; Thin liquids -- monitor Cup use and pt should Hold Cup when drinking. Expect anterior leakage and provide towel under chin. Recommend aspiration precautions as pt uses at Baseline, including small bites/sips slowly and lingual sweep to clear orally b/t bites. Rest Breaks recommended during meals for conservation of energy as well as the Choice of easy to chew foods. Tray setup/Prep of foods and Positioning support for upright sitting for oral intake/meals.    Medication Administration: Crushed with puree

## 2024-04-14 NOTE — Progress Notes (Signed)
 Physical Therapy Treatment Patient Details Name: Claudia Friedman MRN: [id number] DOB: [birth year] Today's Date: 04/14/2024   History of Present Illness Pt is a 89 y.o. female presented to hospital s/p ground level fall sustaining right intertrochanteric fracture s/p R hip rod on 11/9. 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    PT Comments  Pt was seen as co-treat with OT for both pt and staff safety. Pt agrees to session and OOB activity. Maintains eyes closed a lot during session but is able to follow simple commands throughout with increased time. Pt unaware of BM incontinence/ She tolerated exiting LK side of bed, standing to RW, and taking several antalgic step to steps to [facility]. Successful BM prior to standing and ambulating ~ 10 ft with chair follow for additional safety. Pt remains at high risk of falls and will benefit from continued skilled PT at DC to maximize her independence while assisting pt to PLOF. DC recs remain appropriate to maximize her independent and safety with all ADLs.       If plan is discharge home, recommend the following: A little help with walking and/or transfers;A little help with bathing/dressing/bathroom;Assistance with cooking/housework;Assist for transportation;Help with stairs or ramp for entrance     Equipment Recommendations  Rolling walker (2 wheels);BSC/3in1       Precautions / Restrictions Precautions Precautions: Fall Recall of Precautions/Restrictions: Intact Restrictions Weight Bearing Restrictions Per Provider Order: Yes RUE Weight Bearing Per Provider Order: Weight bearing as tolerated RLE Weight Bearing Per Provider Order: Weight bearing as tolerated     Mobility  Bed Mobility Overal bed mobility: Needs Assistance Bed Mobility: Supine to Sit  Supine to sit: Mod assist, +2 for  safety/equipment, Used rails  General bed mobility comments: Pt does require mod assist to exit L side of bed. vcs for sequencing and safety. heavy assistance to safely achieve EOB sitting    Transfers Overall transfer level: Needs assistance Equipment used: Rolling walker (2 wheels) Transfers: Sit to/from Stand Sit to Stand: Min assist, +2 safety/equipment  General transfer comment: patient performed sit<>stand from [facility] for mobility with min A for lifting    Ambulation/Gait Ambulation/Gait assistance: Min assist Gait Distance (Feet): 10 Feet Assistive device: Rolling walker (2 wheels) Gait Pattern/deviations: Step-to pattern, Antalgic, Trunk flexed, Shuffle, Decreased stance time - right, Decreased step length - right, Decreased step length - left, Decreased stance time - left, Decreased stride length Gait velocity: decreased  General Gait Details: Pt was able to tolerate ambulation ~ 12 ft with RW + chair follow for additional safety.    Balance Overall balance assessment: Needs assistance Sitting-balance support: No upper extremity supported Sitting balance-Leahy Scale: Good     Standing balance support: During functional activity Standing balance-Leahy Scale: Fair Standing balance comment: reliant on r/w due to pain in R hip       Communication Communication Communication: Impaired Factors Affecting Communication: Reduced clarity of speech  Cognition Arousal: Alert Behavior During Therapy: WFL for tasks assessed/performed   PT - Cognitive impairments: History of cognitive impairments Difficult to assess due to: Hard of hearing/deaf      Following commands: Intact Following commands impaired: Follows one step commands with increased time    Cueing Cueing Techniques: Verbal cues  Exercises Other Exercises Other Exercises: ambulated 10 feet with r/w with min A x 2 for safety, increased weight bearing thru BUE to off load R  hip, reported pain 8/10 in R hip during  ambulation        Pertinent Vitals/Pain Pain Assessment Pain Assessment: PAINAD Breathing: occasional labored breathing, short period of hyperventilation Negative Vocalization: occasional moan/groan, low speech, negative/disapproving quality Facial Expression: smiling or inexpressive Body Language: relaxed Consolability: distracted or reassured by voice/touch PAINAD Score: 3 Pain Location: R hip Pain Descriptors / Indicators: Sore, Discomfort     PT Goals (current goals can now be found in the care plan section) Acute Rehab PT Goals Patient Stated Goal: getting back to life and doing ADLs Progress towards PT goals: Progressing toward goals    Frequency    7X/week       Co-evaluation   Reason for Co-Treatment: To address functional/ADL transfers;Complexity of the patient's impairments (multi-system involvement) PT goals addressed during session: Mobility/safety with mobility;Proper use of DME OT goals addressed during session: ADL's and self-care      AM-PAC PT 6 Clicks Mobility   Outcome Measure  Help needed turning from your back to your side while in a flat bed without using bedrails?: A Little Help needed moving from lying on your back to sitting on the side of a flat bed without using bedrails?: A Little Help needed moving to and from a bed to a chair (including a wheelchair)?: A Little Help needed standing up from a chair using your arms (e.g., wheelchair or bedside chair)?: A Little Help needed to walk in hospital room?: A Little Help needed climbing 3-5 steps with a railing? : A Little 6 Click Score: 18    End of Session Equipment Utilized During Treatment: Gait belt Activity Tolerance: Patient tolerated treatment well;Patient limited by pain Patient left: in chair;with call bell/phone within reach;with chair alarm set;with nursing/sitter in room Nurse Communication: Mobility status PT Visit Diagnosis: Other abnormalities of gait and mobility  (R26.89);Muscle weakness (generalized) (M62.81);Unsteadiness on feet (R26.81);History of falling (Z91.81);Pain Pain - Right/Left: Right Pain - part of body: Hip     Time: 1000-1024 PT Time Calculation (min) (ACUTE ONLY): 24 min  Charges:    $Therapeutic Activity: 8-22 mins PT General Charges $$ ACUTE PT VISIT: 1 Visit                     Rankin Essex PTA 04/14/24, 12:18 PM

## 2024-04-14 NOTE — Progress Notes (Signed)
   Subjective: 3 Days Post-Op Procedure(s) (LRB): FIXATION, FRACTURE, INTERTROCHANTERIC, WITH INTRAMEDULLARY ROD (Right) Patient doing well. Alert this am with no complaints.  Patient is well, and has had no acute complaints or problems Denies any CP, SOB, ABD pain. We will continue with physical therapy today.   Objective: Vital signs in last 24 hours: Temp:  [97.6 F (36.4 C)-98.4 F (36.9 C)] 98.2 F (36.8 C) (11/12 0745) Pulse Rate:  [61-85] 81 (11/12 0745) Resp:  [16-18] 18 (11/12 0745) BP: (98-124)/(38-59) 116/51 (11/12 0745) SpO2:  [91 %-100 %] 100 % (11/12 0745)  Intake/Output from previous day: No intake/output data recorded. Intake/Output this shift: No intake/output data recorded.  Recent Labs    04/12/24 0740 04/13/24 0552 04/14/24 0441  HGB 8.8* 8.5* 8.3*   Recent Labs    04/13/24 0552 04/14/24 0441  WBC 15.1* 13.6*  RBC 2.68* 2.63*  HCT 26.1* 25.9*  PLT 131* 163   Recent Labs    04/13/24 0552 04/14/24 0441  NA 138 140  K 4.1 4.7  CL 104 107  CO2 24 25  BUN 26* 24*  CREATININE 0.97 0.91  GLUCOSE 90 90  CALCIUM 7.9* 8.1*   No results for input(s): LABPT, INR in the last 72 hours.   EXAM General - Patient is alert and oriented Dressing - dressing C/D/I and no drainage. Minimal swelling. No bruising. Compartments soft. Ankle PF/DF intact. 2+DP pulse   Past Medical History:  Diagnosis Date   Anxiety    History of echocardiogram    a. 12/19/15: EF 60-65%, no RWMA, nl LV diastolic fxn, mild AI, LA nl, PASP nl   History of nuclear stress test    a. 12/19/15: no evidence of ischemia, EF 76%, low risk study   Hypertension     Assessment/Plan:   3 Days Post-Op Procedure(s) (LRB): FIXATION, FRACTURE, INTERTROCHANTERIC, WITH INTRAMEDULLARY ROD (Right) Principal Problem:   Hip fracture due to osteoporosis (HCC) Active Problems:   Anxiety   GERD (gastroesophageal reflux disease)   Depression   Protein-calorie malnutrition, severe    Squamous cell carcinoma in situ  Estimated body mass index is 21.21 kg/m as calculated from the following:   Height as of this encounter: 5' 6 (1.676 m).   Weight as of this encounter: 59.6 kg. Advance diet Up with therapy Pain well controlled VSS Acute post op blood loss anemia - Hgb 8.3. continue with Fe supplement. Recheck Hgb in the am CM to assist with discharge to home vs SNF pending progress with PT   Patient will need 2-week follow-up with [facility] orthopedics Lovenox  40 mg subcu daily x 14 days at discharge. Patient can begin showering 3 days postop with honeycomb dressing.  At 7 days postop patient can remove honeycomb dressings and continue showering with no dressing.  DVT Prophylaxis - Lovenox , TED hose, and SCDs Weight-Bearing as tolerated to right leg   T. Medford Amber, PA-C [facility] 04/14/2024, 8:11 AM

## 2024-04-14 NOTE — Progress Notes (Addendum)
 [facility]  - [place] [facility]   PATIENT NAME: Claudia Friedman    MR#:  [id number]  DATE OF BIRTH:  10/02/1930  SUBJECTIVE:  patient's personal caregivers in the room. She is sitting out in the chair drinking her chocolate ensure. No new complaints. Had a bowel movement per RN    VITALS:  Blood pressure (!) 114/50, pulse 85, temperature 97.9 F (36.6 C), resp. rate 16, height 5' 6 (1.676 m), weight 59.6 kg, SpO2 97%.  PHYSICAL EXAMINATION:   GENERAL:  89 y.o.-year-old patient with no acute distress.  LUNGS: Normal breath sounds bilaterally, no wheezing CARDIOVASCULAR: S1, S2 normal. No murmur   ABDOMEN: Soft, nontender, nondistended. Bowel sounds present.  EXTREMITIES: No  edema b/l.    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   Hip fracture due to osteoporosis (HCC) s/p IM rod placement for intertrochanteric fracture right --prn pain meds --pt/ot--recs rehab. D/w son and he is leaning towards HHPT since pt did not do well in the past --toc aware of d/c plans. HHPT ordered   Squamous cell carcinoma in situ --left maxillary gingiva/retromolar trigone status post dental extraction, alveolectomy, segmental mandibulectomy, oral cavity tumor resection, left neck dissection, tracheostomy, left osteocutaneous radial forearm free flap reconstruction. --Aspiration precaution due to high aspiration risk --SLP  consulted--see details   Anxiety --prn Alprazolam     Protein-calorie malnutrition, severe Nutrition Status: Nutrition Problem: Severe Malnutrition Etiology: chronic illness (maxillary cancer) Signs/Symptoms: moderate fat depletion, severe fat depletion, moderate muscle depletion, severe muscle depletion Interventions: Ensure Enlive (each supplement provides 350kcal and 20 grams of protein), Magic cup, MVI   --Registered dietitian has been consulted   Depression --Escitalopram    GERD (gastroesophageal reflux disease) Home PPI  Palliative care to follow as out pt  Procedures: Family communication :d/w son on the phone Consults :ortho CODE STATUS: FULL DVT Prophylaxis :enoxaparin  Level of care: Med-Surg Status is: Inpatient Remains inpatient appropriate because: awaiting final decision by son for d/c    TOTAL TIME TAKING CARE OF THIS PATIENT: 35 minutes.  >50% time spent on counselling and coordination of care  Note: This dictation was prepared with Dragon dictation along with smaller phrase technology. Any transcriptional errors that result from this process are unintentional.  Leita Blanch M.D    [facility]   CC: Primary care physician; Alla Amis, MD

## 2024-04-14 NOTE — Plan of Care (Signed)
   Problem: Education: Goal: Knowledge of General Education information will improve Description Including pain rating scale, medication(s)/side effects and non-pharmacologic comfort measures Outcome: Progressing

## 2024-04-14 NOTE — Care Management Important Message (Signed)
 Important Message  Patient Details  Name: Claudia Friedman MRN: [id number] Date of Birth: 23-Nov-1930   Important Message Given:  Yes - Medicare IM     Girard Koontz W, CMA 04/14/2024, 12:35 PM

## 2024-04-14 NOTE — Consult Note (Addendum)
 Consultation Note Date: 04/14/2024   Patient Name: Claudia Friedman  DOB: 1931/06/03  MRN: [id number]  Age / Sex: 89 y.o., female  PCP: Alla Amis, MD Referring Physician: Tobie Calix, MD  Reason for Consultation: Establishing goals of care  HPI/Patient Profile: Claudia Friedman is a 89 y.o. 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  She is also status post CRT.  She is being considered for further treatment with chemotherapy.  She had been in her usual state of health until this evening.  She was trying to get to her bathroom when she accidentally fell on the level ground.  She sustained right intertrochanteric fracture.  Symptoms were associated with severe pain.  She was optimized in the ED with analgesics.   Clinical Assessment and Goals of Care: Notes and labs reviewed.  In to see patient.  She is currently sitting in bedside chair at this time.  She has 24/7 paid caregivers that rotate 8-hour shifts.  Patient states she is divorced and lives at home alone aside from these caregivers.  She shares she is a retired [employer].  She states the caregivers began coming around a year ago when she became weak with oncologic intervention.  She discusses some of the interventions that she has undergone, and states she is scheduled to reinitiate treatments soon.  She states she has a son who helps her and would be her surrogate decision maker if she is unable to make decisions for self.  Patient states she does not drive, but goes with her caregivers to grocery stores and to [place].  She states she does not use any assistive devices at baseline.  When broaching goals of care, she states she has a PEG tube in place which she was able to stop using  months ago.  She states since resuming a strictly oral diet, she has not gained weight, but has not lost weight either.  She states she is hopeful not to have to use the PEG tube again, but at this time believes she would be amenable if needed.  With regards to ventilator support and CPR, she states she is not sure how she feels about these interventions after all she has endured, and would need to give this thought.  She states she does not remember having a tracheostomy placed in January during her left neck radical dissection with grafting.  As per those notes, patient was trached short-term and decannulated prior to her discharge from the hospital.  Per notes, during that admission she was evaluated by psychiatry and treated temporarily for hospital-based delirium at that time.  Spoke with patient's caregiver who is at bedside.  She states she is one of a number of people that rotate to spend time with patient.  She is clear that she and her colleagues are not caregivers, but stay with her purely for companionship.  She states patient has maintained full orientation, and is  able to perform all ADLs herself, and does not need assistance.  Spoke with patient's son.  He states he has just spoken with the primary care team and discussed her care and updates.  He discusses his mother's care previously.  He discusses the need for follow-up with [facility] regarding further treatment.  He states his mother has been open to anything that may need to be done to prolong her life up into this point.    We discussed her age, independence, and quality of life.  We discussed imaging completed during this admission.  We discussed ongoing assessment of her status and making decisions based on how she does, while considering boundaries on acceptable quality of life.  Discussed their speaking further regarding those limits and boundaries.  Attending updated.    SUMMARY OF RECOMMENDATIONS   PMT will follow up tomorrow If  patient discharges prior to follow-up, recommend outpatient palliative to continue goals of care discussions.      Primary Diagnoses: Present on Admission:  Hip fracture due to osteoporosis (HCC)  Anxiety   I have reviewed the medical record, interviewed the patient and family, and examined the patient. The following aspects are pertinent.  Past Medical History:  Diagnosis Date   Anxiety    History of echocardiogram    a. 12/19/15: EF 60-65%, no RWMA, nl LV diastolic fxn, mild AI, LA nl, PASP nl   History of nuclear stress test    a. 12/19/15: no evidence of ischemia, EF 76%, low risk study   Hypertension    Social History   Socioeconomic History   Marital status: Divorced    Spouse name: Not on file   Number of children: Not on file   Years of education: Not on file   Highest education level: Not on file  Occupational History   Not on file  Tobacco Use   Smoking status: Never   Smokeless tobacco: Never  Substance and Sexual Activity   Alcohol use: Yes    Comment: occasional    Drug use: No   Sexual activity: Not Currently  Other Topics Concern   Not on file  Social History Narrative   Not on file   Social Drivers of Health   Financial Resource Strain: Low Risk  (04/08/2023)   Received from [facility]   Overall Financial Resource Strain (CARDIA)    Difficulty of Paying Living Expenses: Not hard at all  Food Insecurity: No Food Insecurity (04/11/2024)   Hunger Vital Sign    Worried About Running Out of Food in the Last Year: Never true    Ran Out of Food in the Last Year: Never true  Transportation Needs: No Transportation Needs (04/11/2024)   PRAPARE - Administrator, Civil Service (Medical): No    Lack of Transportation (Non-Medical): No  Physical Activity: Not on file  Stress: Not on file  Social Connections: Moderately Isolated (04/11/2024)   Social Connection and Isolation Panel    Frequency of Communication with Friends and  Family: Twice a week    Frequency of Social Gatherings with Friends and Family: Twice a week    Attends Religious Services: 1 to 4 times per year    Active Member of [employer] or Organizations: No    Attends Banker Meetings: Never    Marital Status: Widowed   Family History  Problem Relation Age of Onset   CAD Other    Osteoporosis Other    Scheduled Meds:  docusate  sodium  100 mg Oral BID   enoxaparin  (LOVENOX ) injection  40 mg Subcutaneous Q24H   escitalopram   5 mg Oral Daily   Fe Fum-Vit C-Vit B12-FA  1 capsule Oral QPC breakfast   feeding supplement  237 mL Oral BID BM   pantoprazole   40 mg Oral Daily   traZODone   50 mg Oral QHS   Continuous Infusions: PRN Meds:.acetaminophen , haloperidol lactate, hydrALAZINE, HYDROcodone-acetaminophen , melatonin, menthol **OR** phenol, metoCLOPramide **OR** metoCLOPramide (REGLAN) injection, ondansetron  **OR** ondansetron  (ZOFRAN ) IV, senna-docusate, traMADol Medications Prior to Admission:  Prior to Admission medications   Medication Sig Start Date End Date Taking? Authorizing Provider  diphenoxylate-atropine (LOMOTIL) 2.5-0.025 MG tablet Take 1 tablet by mouth 4 (four) times daily. 04/02/24 04/02/25 Yes [provider]  escitalopram  (LEXAPRO ) 10 MG tablet Take 10 mg by mouth daily. 08/11/23  Yes [provider]  hydrOXYzine (ATARAX) 10 MG tablet Take 10 mg by mouth 3 (three) times daily.   Yes [provider]  traZODone  (DESYREL ) 50 MG tablet Take 50 mg by mouth at bedtime. 07/11/23  Yes [provider]  acetaminophen  (TYLENOL ) 500 MG tablet Take 500 mg by mouth every 6 (six) hours as needed.    [provider]  ALPRAZolam  (XANAX ) 0.25 MG tablet Take 1 tablet (0.25 mg total) by mouth 2 (two) times daily as needed for anxiety. 10/11/20   Samtani, Jai-Gurmukh, MD  enoxaparin  (LOVENOX ) 40 MG/0.4ML injection Inject 0.4 mLs (40 mg total) into the skin daily for 14 days. 04/13/24 04/27/24  Charlene Debby BROCKS, PA-C  ferrous sulfate  325 (65 FE) MG EC tablet Take 1 tablet (325 mg total) by mouth 2 (two) times daily. 10/11/20 10/11/21  Samtani, Jai-Gurmukh, MD  pantoprazole  (PROTONIX ) 40 MG tablet Take 1 tablet (40 mg total) by mouth daily. 10/12/20   Samtani, Jai-Gurmukh, MD  traMADol (ULTRAM) 50 MG tablet Take 1 tablet (50 mg total) by mouth every 6 (six) hours as needed for moderate pain (pain score 4-6). 04/12/24   Charlene Debby BROCKS, PA-C   No Known Allergies Review of Systems  Musculoskeletal:        Postop hip pain with moving    Physical Exam HENT:     Head:     Comments: Patient has a towel on her chest and has spilled some of her chocolate Ensure from her mouth onto the towel. Pulmonary:     Effort: Pulmonary effort is normal.  Skin:    General: Skin is warm and dry.  Neurological:     Mental Status: She is alert.     Vital Signs: BP (!) 112/56 (BP Location: Right Arm) Comment: nurse made aware  Pulse 86   Temp 98.4 F (36.9 C) (Temporal)   Resp 18   Ht 5' 6 (1.676 m)   Wt 59.6 kg   SpO2 100%   BMI 21.21 kg/m  Pain Scale: 0-10   Pain Score: 0-No pain   SpO2: SpO2: 100 % O2 Device:SpO2: 100 % O2 Flow Rate: .O2 Flow Rate (L/min): 3 L/min  IO: Intake/output summary:  Intake/Output Summary (Last 24 hours) at 04/14/2024 1306 Last data filed at 04/14/2024 0900 Gross per 24 hour  Intake 40 ml  Output --  Net 40 ml    LBM: Last BM Date : 04/14/24 Baseline Weight: Weight: 59.6 kg Most recent weight: Weight: 59.6 kg       Signed by: Camelia Lewis, NP   Please contact Palliative Medicine Team phone at [phone] for questions and concerns.  For  individual provider: See [place]acey

## 2024-04-15 DIAGNOSIS — M80051A Age-related osteoporosis with current pathological fracture, right femur, initial encounter for fracture: Secondary | ICD-10-CM | POA: Diagnosis not present

## 2024-04-15 NOTE — Progress Notes (Signed)
 Speech Language Pathology Treatment: Dysphagia  Patient Details Name: Claudia Friedman MRN: [id number] DOB: 02-Oct-1930 Today's Date: 04/15/2024 Time: 8762-8694 SLP Time Calculation (min) (ACUTE ONLY): 28 min  Assessment / Plan / Recommendation Clinical Impression  Pt seen for ongoing f/u w/ toleration of diet today. Pt awake/alert and verbal. She engaged in general conversation about current events w/ this SLP and Caregiver who was present in the room. She was A/O today; no recent sedating medications per chart, report. Pt and Caregiver ordering Lunch meal- noted order for more puree consistency foods.  Pt is on a liberalized diet of Regular for choices/options w/ recommendation for chopped/cut meats, moistened foods as she has been eating at home. Her Caregivers assist in prepping foods for ease of oral intake/mastication.     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 Recently with recurrence along L mandibular gingiva now s/p composite resection with left segmental mandibulectomy, left neck dissection, tracheostomy, with left radial forearm free flap reconstruction and botox to the right submandibular gland on 06/20/23. Pt was seen at [facility] following her surgery for few tx session by ST services, 6-12/2023(also MBSS in 08/2023 which cleared her for Dysphagia level 1 diet w/ Thin liquids) w/ Recommended Compensatory Techniques: Upright 90 degrees, External pacing, Check for L pocketing, Check for L anterior loss, Double swallow, Alternate liquids and solids, Head tilt Right.  She advanced from Puree > Bite-size foods in her diet (self selection of foods for best comfort) per note. Continued oral phase issues described: shirt has become wet with saliva leaking from asymmetric left side region of oral  cavity due to aforementioned left containment deficit- labial asymmetry and closure deficit. Lacks left Dentition impacting mastication. Inability to maintain proper location of food boluses orally..  She has a PEG -- not using as she prefers to maintain an oral diet now. PO intake is primary w/ no significant wt loss in ~5 months per Dietician note and is using a drink supplement(Ensure) 1x daily w/ the 3-4 meals daily per Caregiver.   Pt appears to present w/ Primary oral phase dysphagia and grossly functional pharyngeal phase swallowing in setting of Known oral phase Dysphagia- see chart notes from [facility]. Sensorimotor deficits present in setting of resection/surgery- see chart notes. Pt has compensated more than adequately in the past several months and has not had to rely on PEG TFs for nutrition. She has consumed an oral diet (primary) w/ No noted/reported Pulmonary decline PTA- see CXRs this admit.  During this visit, both pt and Caregiver report no overt s/s of aspiration during po trials/oral intake this morning at breakfast. However, despite pt's previously reduced risk for aspiration, pt Now appears at risk for aspiration/aspiration pneumonia in setting of New medical issues. When following aspiration precautions and given support, the risk can be reduced but she is Not risk-free. Challenging factors that could impact her oropharyngeal swallowing include Pain/discomfort(NSG aware), fatigue/weakness/deconditioning, recent Fall/surgery/hospitalization, and Advanced Age. These factors can increase risk for aspiration, dysphagia as well as decreased oral intake overall.    During this visit, education and information were provided on general aspiration precautions and strategies to best support bolus management during the oral phase(chewing); discussed foods/prep of foods for ease of chewing. Encouraged pt to [facility] herself during the day eating smaller/mini meals vs large meals; highlighted what doing such  could look like. Pt endorsed she is drinking a Boost daily. Talked about Stamina for eating meals and how to conserve energy during meals. Pt and Caregiver verbalized agreement.    Recommend continue a Regular/mech soft consistency diet for Choice Options w/ well-Chopped/Cut meats, well-moistened foods; Thin liquids -- monitor Cup use and pt should Hold Cup when drinking. Expect anterior leakage and provide towel under chin. Recommend aspiration precautions as pt uses at Baseline, including small bites/sips slowly and lingual sweep to clear orally b/t bites. Rest Breaks and Pacing recommended during meals for conservation of energy as well as the Choice of easy to chew foods. Tray setup/Prep of foods and Positioning support for upright sitting for oral intake/meals.  Pills CRUSHED in Puree for safer, easier swallowing -- pt does this at home, and it was encouraged now and to continue at D/C. If pt is unable to meet her needs orally at this time of her recovery and illness, recommendation would be for PEG TF support; also increased water flushes to support pt. MD/Dietician updated.   Education given on CRUSHED Pills in Puree; food consistencies/prep and easy to chew options; rest breaks and tactics for conservation of energy during meals; general aspiration precautions to pt and Son. Encouraged Dietician f/u for support. Recommend continued Palliative Care for support ahead.  MD/NSG updated. Suspect pt is close to/at her swallowing Baseline BUT NOW HAS NEW medical issues that could negatively impact safety w/ oral intake.  Precautions posted in room, chart.     HPI HPI: Pt is a 89 y.o. 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  She has been following with  [facility] closely for the Cancer; see a new Imaging report.  She is being considered for further treatment with chemotherapy per chart.  She is also status post CRT. She had been in her usual state of health when she fell on the level ground and sustained a right intertrochanteric fracture.  Symptoms were associated with severe pain.  She was optimized in the ED with analgesics.  Seen and evaluated in the ED by orthopedics- now 2 Days Post-Op Procedure(s) (LRB):  FIXATION, FRACTURE, INTERTROCHANTERIC, WITH INTRAMEDULLARY ROD (Right) per chart note.    Pt lives at home w/ 24/7 Caregiver support per NSG.   2 CXRs at this admit: both No acute cardiopulmonary process.      SLP Plan  All goals met          Recommendations  Diet recommendations: Dysphagia 3 (mechanical soft);Regular;Thin liquid (cut/chopped meats, moistened foods) Liquids provided via: Cup (for control of sip size) Medication Administration: Crushed with puree (baseline) Supervision: Patient able to self feed;Staff to assist with self feeding;Intermittent supervision to cue for compensatory strategies Compensations: Minimize environmental distractions;Slow rate;Small sips/bites;Lingual sweep for clearance of pocketing;Multiple dry swallows after each bite/sip;Follow solids with liquid Postural Changes and/or Swallow Maneuvers: Out of bed for meals;Seated upright 90 degrees;Upright 30-60 min after meal                 (Dietician and Palliative Care following) Oral care BID;Oral care before and after PO;Patient independent with oral care;Staff/trained caregiver to provide oral care (support)   Intermittent Supervision/Assistance Dysphagia, oropharyngeal phase (R13.12) (known baseline of oral phase dysphagia s/p resection/surgical repair; deconditioned/fatigued; advanced Age; acture fall/hip fx; Pain)  All goals met       Comer Portugal, MS, CCC-SLP Speech Language Pathologist Rehab Services; [facility]  [facility] [phone] (ascom) Claudia Friedman  04/15/2024, 1:22 PM

## 2024-04-15 NOTE — Discharge Summary (Signed)
 Physician Discharge Summary  Claudia Friedman DOB: [birth year] DOA: 04/10/2024  PCP: Alla Amis, MD  Admit date: 04/10/2024 Discharge date: 04/15/2024  Admitted From: home  Disposition:  home w/ home health   Recommendations for Outpatient Follow-up:  Follow up with PCP in 1-2 weeks F/u w/ ortho surg, PA Charlene or Dr. Lorelle, in 2 weeks  Home Health: yes Equipment/Devices:  Discharge Condition: stable  CODE STATUS: full  Diet recommendation: regular  Brief/Interim Summary: HPI was taken from Dr. Marca: Claudia Friedman is a 89 y.o. 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  She is also status post CRT.  She is being considered for further treatment with chemotherapy.  She had been in her usual state of health until this evening.  She was trying to get to her bathroom when she accidentally fell on the level ground.  She sustained right intertrochanteric fracture.  Symptoms were associated with severe pain.  She was optimized in the ED with analgesics.   Seen and evaluated in the ED by orthopedics.  Patient has been planned for surgery in AM.  She denies any chest pain or recent history of coronary artery disease.  At baseline, patient ambulates with the use of a cane.  Discharge Diagnoses:  Principal Problem:   Hip fracture due to osteoporosis [facility]) Active Problems:   Anxiety   Squamous cell carcinoma in situ   GERD (gastroesophageal reflux disease)   Depression   Protein-calorie malnutrition, severe   Facial laceration  Hip fracture: secondary to osteoporosis. S/p IM rod placement for intertrochanteric fracture right. PT/OT recs SNF. Pt was d/c home w/ home as per pt's son request. Pt has around the clock care w/ aides at home  Squamous cell carcinoma in situ: left maxillary gingiva/retromolar trigone  status post dental extraction, alveolectomy, segmental mandibulectomy, oral cavity tumor resection, left neck dissection, tracheostomy, left osteocutaneous radial forearm free flap reconstruction. Aspiration precautions   Anxiety: severity unknown. Continue on home dose of lexapro     Severe protein-calorie malnutrition: continue w/ nutritional supplements   Depression: severity unknown. Continue on home dose of lexapro    GERD: continue on PPI   Discharge Instructions  Discharge Instructions     Diet general   Complete by: As directed    Discharge instructions   Complete by: As directed    F/u w/ PCP in 1-2 weeks. F/u w/ ortho surg, Dr. Lorelle, in 2 weeks. Lovenox  40 mg subcu daily x 14 days at discharge.Patient can begin showering 3 days postop with honeycomb dressing.  At 7 days postop patient can remove honeycomb dressings and continue showering with no dressing as per ortho surg   Increase activity slowly   Complete by: As directed    No wound care   Complete by: As directed       Allergies as of 04/15/2024   No Known Allergies      Medication List     STOP taking these medications    ALPRAZolam  0.25 MG tablet Commonly known as: XANAX        TAKE these medications    acetaminophen  500 MG tablet Commonly known as: TYLENOL  Take 500 mg by mouth every 6 (six) hours as needed.   diphenoxylate-atropine 2.5-0.025 MG tablet Commonly known as: LOMOTIL Take 1 tablet by mouth 4 (four) times daily.   enoxaparin  40 MG/0.4ML injection Commonly known as: LOVENOX  Inject 0.4 mLs (40 mg  total) into the skin daily for 14 days.   escitalopram  10 MG tablet Commonly known as: LEXAPRO  Take 10 mg by mouth daily.   ferrous sulfate  325 (65 FE) MG EC tablet Take 1 tablet (325 mg total) by mouth 2 (two) times daily.   hydrOXYzine 10 MG tablet Commonly known as: ATARAX Take 10 mg by mouth 3 (three) times daily.   pantoprazole  40 MG tablet Commonly known as: PROTONIX  Take 1  tablet (40 mg total) by mouth daily.   traMADol 50 MG tablet Commonly known as: ULTRAM Take 1 tablet (50 mg total) by mouth every 6 (six) hours as needed for moderate pain (pain score 4-6).   traZODone  50 MG tablet Commonly known as: DESYREL  Take 50 mg by mouth at bedtime.        Contact information for follow-up providers     Charlene Debby BROCKS, PA-C Follow up in 2 week(s).   Specialties: Orthopedic Surgery, Emergency Medicine Contact information: [address] [place] [address] [phone]         Alla Amis, MD. Schedule an appointment as soon as possible for a visit in 1 week(s).   Specialty: Family Medicine Contact information: [address] [facility] [place] [address] [phone]              Contact information for after-discharge care     Home Medical Care     [facility]) .   Service: Home Health Services Contact information: [address] [place]  [address] [phone]                    No Known Allergies  Consultations: Ortho surg  Procedures/Studies: DG HIP UNILAT WITH PELVIS 2-3 VIEWS RIGHT Result Date: 04/11/2024 CLINICAL DATA:  Fracture EXAM: DG HIP (WITH OR WITHOUT PELVIS) 2-3V RIGHT COMPARISON:  Radiograph yesterday FINDINGS: Six fluoroscopic spot views of the right hip submitted from the operating room. Femoral intramedullary nail with trans trochanteric and distal locking screw fixation of proximal femur fracture. Fluoroscopy time 1 minutes 15 seconds. Dose 14.51 mGy. IMPRESSION: Fluoroscopic spot views during proximal femur fracture ORIF. Electronically Signed   By: Andrea Gasman M.D.   On: 04/11/2024 11:39   DG C-Arm 1-60 Min-No Report Result Date: 04/11/2024 Fluoroscopy was utilized by the requesting physician.  No radiographic interpretation.   DG Chest Portable 1 View Result Date: 04/10/2024 EXAM: 1 VIEW(S) XRAY OF THE CHEST  04/10/2024 11:25:17 PM COMPARISON: 04/10/2024 CLINICAL HISTORY: Hip fracture, elevated white count FINDINGS: LUNGS AND PLEURA: No focal pulmonary opacity. No pulmonary edema. No pleural effusion. No pneumothorax. HEART AND MEDIASTINUM: Tortuous aorta with atherosclerosis. No acute abnormality of the cardiac silhouette. BONES AND SOFT TISSUES: No acute osseous abnormality. IMPRESSION: 1. No acute cardiopulmonary process. Electronically signed by: Oneil Devonshire MD 04/10/2024 11:29 PM EST RP Workstation: [id number]   CT Cervical Spine Wo Contrast Result Date: 04/10/2024 EXAM: CT CERVICAL SPINE WITHOUT CONTRAST 04/10/2024 09:47:19 PM TECHNIQUE: CT of the cervical spine was performed without the administration of intravenous contrast. Multiplanar reformatted images are provided for review. Automated exposure control, iterative reconstruction, and/or weight based adjustment of the mA/kV was utilized to reduce the radiation dose to as low as reasonably achievable. COMPARISON: CT neck 04/30/2023. CLINICAL HISTORY: Facial trauma, blunt. FINDINGS: CERVICAL SPINE: BONES AND ALIGNMENT: Cervical lordosis is maintained. Similar appearance of anterolisthesis of C2 on C3. Similar appearance of anterolisthesis of C7 on T1 with slight asymmetric widening of  the disc space which is unchanged from prior. No acute fracture or traumatic malalignment. DEGENERATIVE CHANGES: Degenerative endplate osteophytes and severe disc space narrowing at multiple levels which is similar to prior. Prominent partially visualized postsurgical changes in the left neck. Disc osteophyte complexes at multiple levels. There is mild spinal canal stenosis at C3-C4 through C5-C6. Facet arthrosis and uncovertebral hypertrophy at multiple levels. Significant foraminal stenosis at multiple levels throughout the cervical spine. SOFT TISSUES: No prevertebral soft tissue swelling. IMPRESSION: 1. No acute abnormality of the cervical spine related to the facial trauma.  2. Degenerative changes as above. 3. Alignment similar to prior. Electronically signed by: Donnice Mania MD 04/10/2024 10:11 PM EST RP Workstation: [id number]   CT Maxillofacial Wo Contrast Result Date: 04/10/2024 EXAM: CT OF THE FACE WITHOUT CONTRAST 04/10/2024 09:47:19 PM TECHNIQUE: CT of the face was performed without the administration of intravenous contrast. Multiplanar reformatted images are provided for review. Automated exposure control, iterative reconstruction, and/or weight based adjustment of the mA/kV was utilized to reduce the radiation dose to as low as reasonably achievable. COMPARISON: None available. CLINICAL HISTORY: Facial trauma, blunt; note, history of left mandibular surgery. FINDINGS: FACIAL BONES: Postsurgical changes of partial resection of the left body of the mandible with osseous graft noted. There is plate and screw fixation of the left aspect of the mandible extending from the angle of the mandible into the paracentral region. The hardware appears intact. Subtle irregularity of the nasal bones which appears new since the 2022 head CT. There is slight lateral apex angulation of the left nasal bone concerning for minimally displaced fractures. The facial bones are otherwise intact. There is no mandibular condyle dislocation. No suspicious bone lesion. ORBITS: Globes are intact. Lenses are normally located. There is right preseptal soft tissue swelling with mild swelling of the eyelids. There is no evidence of postseptal swelling. No acute traumatic injury. No inflammatory change. SINUSES AND MASTOIDS: Mucosal thickening in the left maxillary sinus with thickening of the sinus wall suggestive of mucoperiosteal reaction. Findings are suggestive of chronic left maxillary sinusitis. There is focal chronic dehiscence in the floor of the left maxillary sinus without evidence of soft tissue communication between the oral cavity and the sinus. Small air fluid level in the right maxillary  sinus. Recommend correlation with tenderness over the sinus. Additional scattered mucosal thickening in the ethmoid sinuses. Trace mucosal thickening in the sphenoid sinuses. Concha bullosa of the bilateral middle turbinates. No acute abnormality in the mastoids. SOFT TISSUES: Focal soft tissue swelling over the right zygomatic arch. Changes in the left submandibular space sequelae left sided neck dissection. There is a partially visualized 2.7 x 2.4 cm right submandibular/submental mass involving the subcutaneous tissues in the right anterior neck. This lesion is new compared to the CT neck on 04/30/23. Recommended correlation with nonemergent biopsy. No acute abnormality in other visualized soft tissues. IMPRESSION: 1. Subtle irregularity of the nasal bones with slight lateral apex angulation of the left nasal bone, concerning for minimally displaced fractures, new since the 2022 head CT. 2. Partially visualized 2.7 x 2.4 cm right submandibular/submental mass, new compared to the CT neck on 04/30/23. Recommend nonemergent biopsy. 3. Postsurgical changes of partial resection of the left body of the mandible with osseous graft and plate and screw fixation. 4. Chronic left maxillary sinusitis with focal chronic dehiscence in the floor of the left maxillary sinus without evidence of soft tissue communication between the oral cavity and the sinus. 5. Small air-fluid level in the right  maxillary sinus. Electronically signed by: Donnice Mania MD 04/10/2024 10:04 PM EST RP Workstation: [id number]   CT Head Wo Contrast Result Date: 04/10/2024 EXAM: CT HEAD WITHOUT CONTRAST 04/10/2024 09:47:19 PM TECHNIQUE: CT of the head was performed without the administration of intravenous contrast. Automated exposure control, iterative reconstruction, and/or weight based adjustment of the mA/kV was utilized to reduce the radiation dose to as low as reasonably achievable. COMPARISON: CT head 10/07/2020. CLINICAL HISTORY: Facial  trauma, blunt. FINDINGS: BRAIN AND VENTRICLES: No acute hemorrhage. No evidence of acute infarct. No hydrocephalus. No extra-axial collection. No mass effect or midline shift. Mild generalized parenchymal volume loss. Appearance of mild to moderate chronic microvascular ischemic changes. Atherosclerosis of the carotid siphons. ORBITS: No acute abnormality. SINUSES: Mucosal thickening in the ethmoid and maxillary sinuses. SOFT TISSUES AND SKULL: No acute soft tissue abnormality. No skull fracture. IMPRESSION: 1. No acute intracranial abnormality related to the facial trauma. 2. Mild generalized parenchymal volume loss. 3. Mild to moderate chronic microvascular ischemic changes. Electronically signed by: Donnice Mania MD 04/10/2024 09:54 PM EST RP Workstation: [id number]   CT Hip Right Wo Contrast Result Date: 04/10/2024 EXAM: CT OF THE RIGHT HIP WITHOUT IV CONTRAST 04/10/2024 09:47:19 PM TECHNIQUE: CT of the right hip was performed without the administration of intravenous contrast. Multiplanar reformatted images are provided for review. Automated exposure control, iterative reconstruction, and/or weight based adjustment of the mA/kV was utilized to reduce the radiation dose to as low as reasonably achievable. COMPARISON: None available. CLINICAL HISTORY: Fracture, hip. FINDINGS: BONES: Intratrochanteric right hip fracture, mildly comminuted and impacted, with varus angulation/foreshortening. No aggressive appearing osseous abnormality or periostitis. SOFT TISSUE: Overlying lateral soft tissue hematoma measuring 4.4 x 9.9 cm. No soft tissue mass. JOINT: No significant degenerative changes. No osseous erosions. INTRAPELVIC CONTENTS: Limited images of the intrapelvic contents are unremarkable. IMPRESSION: 1. Intratrochanteric right hip fracture, as above. 2. Overlying lateral soft tissue hematoma. Electronically signed by: Pinkie Pebbles MD 04/10/2024 09:51 PM EST RP Workstation: [id number]   DG Chest 1  View Result Date: 04/10/2024 EXAM: 1 VIEW(S) XRAY OF THE CHEST 04/10/2024 08:17:43 PM COMPARISON: CT chest dated 04/30/2023. CLINICAL HISTORY: Fall. FINDINGS: LUNGS AND PLEURA: No focal pulmonary opacity. No pulmonary edema. No pleural effusion. No pneumothorax. HEART AND MEDIASTINUM: No acute abnormality of the cardiac and mediastinal silhouettes. BONES AND SOFT TISSUES: No acute osseous abnormality. IMPRESSION: 1. No acute cardiopulmonary process. Electronically signed by: Pinkie Pebbles MD 04/10/2024 08:24 PM EST RP Workstation: [id number]   DG Femur Min 2 Views Right Result Date: 04/10/2024 EXAM: 2 VIEW(S) XRAY OF THE RIGHT FEMUR 04/10/2024 08:17:43 PM COMPARISON: None available. CLINICAL HISTORY: fall, pain mid femur and R hip FINDINGS: BONES AND JOINTS: Acute intertrochanteric fracture of right hip with mild varus angulation. Degenerative changes in right knee. No focal osseous lesion. No joint dislocation. SOFT TISSUES: The soft tissues are unremarkable. IMPRESSION: 1. Acute intertrochanteric fracture of the right hip. Electronically signed by: Pinkie Pebbles MD 04/10/2024 08:23 PM EST RP Workstation: [id number]   DG Hip Unilat W or Wo Pelvis 2-3 Views Right Result Date: 04/10/2024 EXAM: 2 or 3 VIEW(S) XRAY OF THE RIGHT HIP 04/10/2024 08:17:43 PM COMPARISON: None available. CLINICAL HISTORY: fall fall FINDINGS: BONES AND JOINTS: The bones are diffusely osteopenic. There is an acute right femoral intertrochanteric fracture which appears mildly comminuted and impacted. Sclerotic focus in the left femoral head measuring 11 mm, possibly a bone island. There is no dislocation. The hip joint is maintained. No significant degenerative changes. SOFT TISSUES:  The soft tissues are unremarkable. IMPRESSION: 1. Acute right femoral intertrochanteric fracture, mildly comminuted and impacted. Electronically signed by: Greig Pique MD 04/10/2024 08:22 PM EST RP Workstation: [id number]   (Echo, Carotid, EGD,  Colonoscopy, ERCP)    Subjective: Pt c/o fatigue    Discharge Exam: Vitals:   04/15/24 0434 04/15/24 0744  BP: (!) 105/56 (!) 121/52  Pulse: 75 79  Resp: 18 16  Temp: 97.8 F (36.6 C) 98.7 F (37.1 C)  SpO2: 95% 97%   Vitals:   04/14/24 1527 04/14/24 1949 04/15/24 0434 04/15/24 0744  BP: (!) 114/50 (!) 104/51 (!) 105/56 (!) 121/52  Pulse: 85 70 75 79  Resp: 16 18 18 16   Temp: 97.9 F (36.6 C)  97.8 F (36.6 C) 98.7 F (37.1 C)  TempSrc:  Oral Oral   SpO2: 97% 100% 95% 97%  Weight:      Height:        General: Pt is alert, awake, not in acute distress. Frail appearing  Cardiovascular: S1/S2 +, no rubs, no gallops Respiratory: CTA bilaterally, no wheezing, no rhonchi Abdominal: Soft, NT, ND, bowel sounds + Extremities: no edema, no cyanosis    The results of significant diagnostics from this hospitalization (including imaging, microbiology, ancillary and laboratory) are listed below for reference.     Microbiology: Recent Results (from the past 240 hours)  Resp panel by RT-PCR (RSV, Flu A&B, Covid) Anterior Nasal Swab     Status: None   Collection Time: 04/10/24 11:16 PM   Specimen: Anterior Nasal Swab  Result Value Ref Range Status   SARS Coronavirus 2 by RT PCR NEGATIVE NEGATIVE Final    Comment: (NOTE) SARS-CoV-2 target nucleic acids are NOT DETECTED.  The SARS-CoV-2 RNA is generally detectable in upper respiratory specimens during the acute phase of infection. The lowest concentration of SARS-CoV-2 viral copies this assay can detect is 138 copies/mL. A negative result does not preclude SARS-Cov-2 infection and should not be used as the sole basis for treatment or other patient management decisions. A negative result may occur with  improper specimen collection/handling, submission of specimen other than nasopharyngeal swab, presence of viral mutation(s) within the areas targeted by this assay, and inadequate number of viral copies(<138 copies/mL). A  negative result must be combined with clinical observations, patient history, and epidemiological information. The expected result is Negative.  Fact Sheet for Patients:  bloggercourse.com  Fact Sheet for Healthcare Providers:  seriousbroker.it  This test is no t yet approved or cleared by the [place]  FDA and  has been authorized for detection and/or diagnosis of SARS-CoV-2 by FDA under an Emergency Use Authorization (EUA). This EUA will remain  in effect (meaning this test can be used) for the duration of the COVID-19 declaration under Section 564(b)(1) of the Act, 21 U.S.C.section 360bbb-3(b)(1), unless the authorization is terminated  or revoked sooner.       Influenza A by PCR NEGATIVE NEGATIVE Final   Influenza B by PCR NEGATIVE NEGATIVE Final    Comment: (NOTE) The Xpert Xpress SARS-CoV-2/FLU/RSV plus assay is intended as an aid in the diagnosis of influenza from Nasopharyngeal swab specimens and should not be used as a sole basis for treatment. Nasal washings and aspirates are unacceptable for Xpert Xpress SARS-CoV-2/FLU/RSV testing.  Fact Sheet for Patients: bloggercourse.com  Fact Sheet for Healthcare Providers: seriousbroker.it  This test is not yet approved or cleared by the [place]  FDA and has been authorized for detection and/or diagnosis of SARS-CoV-2 by FDA under an  Emergency Use Authorization (EUA). This EUA will remain in effect (meaning this test can be used) for the duration of the COVID-19 declaration under Section 564(b)(1) of the Act, 21 U.S.C. section 360bbb-3(b)(1), unless the authorization is terminated or revoked.     Resp Syncytial Virus by PCR NEGATIVE NEGATIVE Final    Comment: (NOTE) Fact Sheet for Patients: bloggercourse.com  Fact Sheet for Healthcare  Providers: seriousbroker.it  This test is not yet approved or cleared by the [place]  FDA and has been authorized for detection and/or diagnosis of SARS-CoV-2 by FDA under an Emergency Use Authorization (EUA). This EUA will remain in effect (meaning this test can be used) for the duration of the COVID-19 declaration under Section 564(b)(1) of the Act, 21 U.S.C. section 360bbb-3(b)(1), unless the authorization is terminated or revoked.  Performed at [facility], [address] [place], [place] [address]      Labs: BNP (last 3 results) No results for input(s): BNP in the last 8760 hours. Basic Metabolic Panel: Recent Labs  Lab 04/10/24 2202 04/12/24 0740 04/13/24 0552 04/14/24 0441  NA 138 138 138 140  K 3.5 4.4 4.1 4.7  CL 100 102 104 107  CO2 23 26 24 25   GLUCOSE 112* 96 90 90  BUN 24* 25* 26* 24*  CREATININE 1.09* 1.02* 0.97 0.91  CALCIUM 8.2* 8.1* 7.9* 8.1*   Liver Function Tests: No results for input(s): AST, ALT, ALKPHOS, BILITOT, PROT, ALBUMIN in the last 168 hours. No results for input(s): LIPASE, AMYLASE in the last 168 hours. No results for input(s): AMMONIA in the last 168 hours. CBC: Recent Labs  Lab 04/10/24 2202 04/12/24 0740 04/13/24 0552 04/14/24 0441  WBC 23.0* 11.2* 15.1* 13.6*  HGB 10.9* 8.8* 8.5* 8.3*  HCT 33.2* 26.9* 26.1* 25.9*  MCV 96.0 97.5 97.4 98.5  PLT 197 136* 131* 163   Cardiac Enzymes: No results for input(s): CKTOTAL, CKMB, CKMBINDEX, TROPONINI in the last 168 hours. BNP: Invalid input(s): POCBNP CBG: No results for input(s): GLUCAP in the last 168 hours. D-Dimer No results for input(s): DDIMER in the last 72 hours. Hgb A1c No results for input(s): HGBA1C in the last 72 hours. Lipid Profile No results for input(s): CHOL, HDL, LDLCALC, TRIG, CHOLHDL, LDLDIRECT in the last 72 hours. Thyroid function studies No results for input(s):  TSH, T4TOTAL, T3FREE, THYROIDAB in the last 72 hours.  Invalid input(s): FREET3 Anemia work up No results for input(s): VITAMINB12, FOLATE, FERRITIN, TIBC, IRON, RETICCTPCT in the last 72 hours. Urinalysis    Component Value Date/Time   COLORURINE YELLOW (A) 04/10/2024 2316   APPEARANCEUR CLEAR (A) 04/10/2024 2316   LABSPEC 1.032 (H) 04/10/2024 2316   PHURINE 7.0 04/10/2024 2316   GLUCOSEU NEGATIVE 04/10/2024 2316   HGBUR SMALL (A) 04/10/2024 2316   BILIRUBINUR NEGATIVE 04/10/2024 2316   KETONESUR NEGATIVE 04/10/2024 2316   PROTEINUR 30 (A) 04/10/2024 2316   NITRITE NEGATIVE 04/10/2024 2316   LEUKOCYTESUR NEGATIVE 04/10/2024 2316   Sepsis Labs Recent Labs  Lab 04/10/24 2202 04/12/24 0740 04/13/24 0552 04/14/24 0441  WBC 23.0* 11.2* 15.1* 13.6*   Microbiology Recent Results (from the past 240 hours)  Resp panel by RT-PCR (RSV, Flu A&B, Covid) Anterior Nasal Swab     Status: None   Collection Time: 04/10/24 11:16 PM   Specimen: Anterior Nasal Swab  Result Value Ref Range Status   SARS Coronavirus 2 by RT PCR NEGATIVE NEGATIVE Final    Comment: (NOTE) SARS-CoV-2 target nucleic acids are NOT DETECTED.  The SARS-CoV-2 RNA is generally detectable in upper respiratory specimens during the acute phase of infection. The lowest concentration of SARS-CoV-2 viral copies this assay can detect is 138 copies/mL. A negative result does not preclude SARS-Cov-2 infection and should not be used as the sole basis for treatment or other patient management decisions. A negative result may occur with  improper specimen collection/handling, submission of specimen other than nasopharyngeal swab, presence of viral mutation(s) within the areas targeted by this assay, and inadequate number of viral copies(<138 copies/mL). A negative result must be combined with clinical observations, patient history, and epidemiological information. The expected result is Negative.  Fact  Sheet for Patients:  bloggercourse.com  Fact Sheet for Healthcare Providers:  seriousbroker.it  This test is no t yet approved or cleared by the [place]  FDA and  has been authorized for detection and/or diagnosis of SARS-CoV-2 by FDA under an Emergency Use Authorization (EUA). This EUA will remain  in effect (meaning this test can be used) for the duration of the COVID-19 declaration under Section 564(b)(1) of the Act, 21 U.S.C.section 360bbb-3(b)(1), unless the authorization is terminated  or revoked sooner.       Influenza A by PCR NEGATIVE NEGATIVE Final   Influenza B by PCR NEGATIVE NEGATIVE Final    Comment: (NOTE) The Xpert Xpress SARS-CoV-2/FLU/RSV plus assay is intended as an aid in the diagnosis of influenza from Nasopharyngeal swab specimens and should not be used as a sole basis for treatment. Nasal washings and aspirates are unacceptable for Xpert Xpress SARS-CoV-2/FLU/RSV testing.  Fact Sheet for Patients: bloggercourse.com  Fact Sheet for Healthcare Providers: seriousbroker.it  This test is not yet approved or cleared by the [place]  FDA and has been authorized for detection and/or diagnosis of SARS-CoV-2 by FDA under an Emergency Use Authorization (EUA). This EUA will remain in effect (meaning this test can be used) for the duration of the COVID-19 declaration under Section 564(b)(1) of the Act, 21 U.S.C. section 360bbb-3(b)(1), unless the authorization is terminated or revoked.     Resp Syncytial Virus by PCR NEGATIVE NEGATIVE Final    Comment: (NOTE) Fact Sheet for Patients: bloggercourse.com  Fact Sheet for Healthcare Providers: seriousbroker.it  This test is not yet approved or cleared by the [place]  FDA and has been authorized for detection and/or diagnosis of SARS-CoV-2 by FDA under an  Emergency Use Authorization (EUA). This EUA will remain in effect (meaning this test can be used) for the duration of the COVID-19 declaration under Section 564(b)(1) of the Act, 21 U.S.C. section 360bbb-3(b)(1), unless the authorization is terminated or revoked.  Performed at [facility], [address] [place], [place] [address]      Time coordinating discharge: 34 minutes  SIGNED:   Anthony CHRISTELLA Pouch, MD  [facility] 04/15/2024, 1:49 PM Pager   If 7PM-7AM, please contact night-coverage [email or web address]

## 2024-04-15 NOTE — Progress Notes (Signed)
 Physical Therapy Treatment Patient Details Name: Claudia Friedman MRN: [id number] DOB: [birth year] Today's Date: 04/15/2024   History of Present Illness Pt is a 89 y.o. female presented to hospital s/p ground level fall sustaining right intertrochanteric fracture s/p R hip rod on 11/9. 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    PT Comments  Arrived to pt asleep in bed with private caregiver bedside. Pt had mentioned that her son had come in to see her this morning. Pt was A and O x3 and was slower moving from waking up. Pt performed supine to sit to EOB with Min A with grabbing author's hand and author assisting by grabbing LE. Performed sit to stand with Min A to RW to help with standing balance. During the transfer, pt had loose, black bowel movement. Performed cleaning in standing position. Pt performed stand to sit back to the bed with Min A. Performed sit to supine with Min A. Discharge recs still appropriate. Continue with POC.   If plan is discharge home, recommend the following: A little help with walking and/or transfers;A little help with bathing/dressing/bathroom;Assistance with cooking/housework;Assist for transportation;Help with stairs or ramp for entrance   Can travel by private vehicle     No  Equipment Recommendations  Rolling walker (2 wheels);BSC/3in1    Recommendations for Other Services       Precautions / Restrictions Precautions Precautions: Fall Recall of Precautions/Restrictions: Intact Restrictions Weight Bearing Restrictions Per Provider Order: Yes RUE Weight Bearing Per Provider Order: Weight bearing as tolerated RLE Weight Bearing Per Provider Order: Weight bearing as tolerated     Mobility  Bed Mobility Overal bed mobility: Needs Assistance Bed Mobility: Supine to Sit     Supine to sit: Min  assist Sit to supine: Min assist   General bed mobility comments: Pt requires some cueing and Min A with LE    Transfers Overall transfer level: Needs assistance Equipment used: Rolling walker (2 wheels) Transfers: Sit to/from Stand Sit to Stand: Min assist           General transfer comment: Pt performed sit to stand to RW to help with balance           Balance Overall balance assessment: Needs assistance Sitting-balance support: No upper extremity supported Sitting balance-Leahy Scale: Good   Postural control: Posterior lean Standing balance support: Bilateral upper extremity supported, During functional activity, Reliant on assistive device for balance         Communication Communication Communication: Impaired Factors Affecting Communication: Reduced clarity of speech  Cognition Arousal: Alert Behavior During Therapy: WFL for tasks assessed/performed   PT - Cognitive impairments: History of cognitive impairments Difficult to assess due to: Hard of hearing/deaf       Following commands: Intact Following commands impaired: Follows one step commands with increased time    Cueing Cueing Techniques: Verbal cues     General Comments General comments (skin integrity, edema, etc.): While performing sit to stand to RW, pt had black, loose bowel movement requiring pericare. Pt was on roomair      Pertinent Vitals/Pain Pain Assessment Pain Assessment: PAINAD Breathing: occasional labored breathing, short period of hyperventilation Negative Vocalization: occasional moan/groan, low speech, negative/disapproving quality Facial Expression: smiling or inexpressive Body Language: relaxed Consolability: distracted or reassured by voice/touch PAINAD Score: 3 Pain Location: R hip Pain Descriptors / Indicators: Sore, Discomfort Pain Intervention(s): Limited activity within patient's  tolerance     PT Goals (current goals can now be found in the care plan section)  Acute Rehab PT Goals Patient Stated Goal: getting back to life and doing ADLs Progress towards PT goals: Progressing toward goals    Frequency    7X/week       AM-PAC PT 6 Clicks Mobility   Outcome Measure  Help needed turning from your back to your side while in a flat bed without using bedrails?: A Little Help needed moving from lying on your back to sitting on the side of a flat bed without using bedrails?: A Little Help needed moving to and from a bed to a chair (including a wheelchair)?: A Little Help needed standing up from a chair using your arms (e.g., wheelchair or bedside chair)?: A Little Help needed to walk in hospital room?: A Little Help needed climbing 3-5 steps with a railing? : A Little 6 Click Score: 18    End of Session Equipment Utilized During Treatment: Gait belt Activity Tolerance: No increased pain;Patient limited by fatigue;Other (comment) (Limited because of bowel movement) Patient left: in bed;with call bell/phone within reach;with bed alarm set Nurse Communication: Mobility status PT Visit Diagnosis: Other abnormalities of gait and mobility (R26.89);Muscle weakness (generalized) (M62.81);Unsteadiness on feet (R26.81);History of falling (Z91.81);Pain Pain - Right/Left: Right Pain - part of body: Hip     Time: 8892-8880 PT Time Calculation (min) (ACUTE ONLY): 12 min  Charges:    $Therapeutic Activity: 8-22 mins PT General Charges $$ ACUTE PT VISIT: 1 Visit                     Signe Uri Turnbough SPTA    Norlan Rann 04/15/2024, 1:00 PM

## 2024-04-15 NOTE — Plan of Care (Signed)
°  Problem: Education: °Goal: Knowledge of General Education information will improve °Description: Including pain rating scale, medication(s)/side effects and non-pharmacologic comfort measures °Outcome: Progressing °  °Problem: Clinical Measurements: °Goal: Ability to maintain clinical measurements within normal limits will improve °Outcome: Progressing °Goal: Respiratory complications will improve °Outcome: Progressing °  °Problem: Nutrition: °Goal: Adequate nutrition will be maintained °Outcome: Progressing °  °Problem: Coping: °Goal: Level of anxiety will decrease °Outcome: Progressing °  °

## 2024-04-15 NOTE — Progress Notes (Signed)
   Subjective: 4 Days Post-Op Procedure(s) (LRB): FIXATION, FRACTURE, INTERTROCHANTERIC, WITH INTRAMEDULLARY ROD (Right) Patient doing well. Alert this am with no complaints. Family at bedside Patient is well, and has had no acute complaints or problems Denies any CP, SOB, ABD pain. We will continue with physical therapy today.   Objective: Vital signs in last 24 hours: Temp:  [97.8 F (36.6 C)-98.7 F (37.1 C)] 98.7 F (37.1 C) (11/13 0744) Pulse Rate:  [70-88] 79 (11/13 0744) Resp:  [16-19] 16 (11/13 0744) BP: (104-121)/(50-56) 121/52 (11/13 0744) SpO2:  [95 %-100 %] 97 % (11/13 0744)  Intake/Output from previous day: 11/12 0701 - 11/13 0700 In: 40 [P.O.:40] Out: -  Intake/Output this shift: No intake/output data recorded.  Recent Labs    04/13/24 0552 04/14/24 0441  HGB 8.5* 8.3*   Recent Labs    04/13/24 0552 04/14/24 0441  WBC 15.1* 13.6*  RBC 2.68* 2.63*  HCT 26.1* 25.9*  PLT 131* 163   Recent Labs    04/13/24 0552 04/14/24 0441  NA 138 140  K 4.1 4.7  CL 104 107  CO2 24 25  BUN 26* 24*  CREATININE 0.97 0.91  GLUCOSE 90 90  CALCIUM 7.9* 8.1*   No results for input(s): LABPT, INR in the last 72 hours.   EXAM General - Patient is alert and oriented Dressing - dressing C/D/I and no drainage. Minimal swelling. No bruising. Compartments soft. Ankle PF/DF intact. 2+DP pulse   Past Medical History:  Diagnosis Date   Anxiety    History of echocardiogram    a. 12/19/15: EF 60-65%, no RWMA, nl LV diastolic fxn, mild AI, LA nl, PASP nl   History of nuclear stress test    a. 12/19/15: no evidence of ischemia, EF 76%, low risk study   Hypertension     Assessment/Plan:   4 Days Post-Op Procedure(s) (LRB): FIXATION, FRACTURE, INTERTROCHANTERIC, WITH INTRAMEDULLARY ROD (Right) Principal Problem:   Hip fracture due to osteoporosis (HCC) Active Problems:   Anxiety   GERD (gastroesophageal reflux disease)   Depression   Protein-calorie  malnutrition, severe   Squamous cell carcinoma in situ   Facial laceration  Estimated body mass index is 21.21 kg/m as calculated from the following:   Height as of this encounter: 5' 6 (1.676 m).   Weight as of this encounter: 59.6 kg. Advance diet Up with therapy Pain well controlled VSS Acute post op blood loss anemia - Hgb 8.3. continue with Fe supplement.  CM to assist with discharge to home vs SNF pending progress with PT   Patient will need 2-week follow-up with [facility] orthopedics Lovenox  40 mg subcu daily x 14 days at discharge. Patient can begin showering 3 days postop with honeycomb dressing.  At 7 days postop patient can remove honeycomb dressings and continue showering with no dressing.  DVT Prophylaxis - Lovenox , TED hose, and SCDs Weight-Bearing as tolerated to right leg   T. Medford Amber, PA-C [facility] 04/15/2024, 9:37 AM

## 2024-04-15 NOTE — Progress Notes (Addendum)
 Daily Progress Note   Patient Name: Claudia Friedman       Date: 04/15/2024 DOB: 07-15-1930  Age: 89 y.o. MRN#: [id number] Attending Physician: Trudy Anthony HERO, MD Primary Care Physician: Alla Amis, MD Admit Date: 04/10/2024  Reason for Consultation/Follow-up: Establishing goals of care  Subjective: Notes and labs reviewed.  Into see patient.  She is currently resting in bed at this time after being cleaned up by staff.  Her caregiver is at bedside.  She discusses that patient typically takes Lomotil for diarrhea at home.  Epic chat sent to attending team.  Caregiver discusses that discharge planning is underway.  Length of Stay: 4  Current Medications: Scheduled Meds:   docusate sodium  100 mg Oral BID   enoxaparin  (LOVENOX ) injection  40 mg Subcutaneous Q24H   escitalopram   5 mg Oral Daily   Fe Fum-Vit C-Vit B12-FA  1 capsule Oral QPC breakfast   feeding supplement  237 mL Oral BID BM   pantoprazole   40 mg Oral Daily   traZODone   50 mg Oral QHS    Continuous Infusions:   PRN Meds: acetaminophen , haloperidol lactate, hydrALAZINE, loperamide, melatonin, menthol **OR** phenol, metoCLOPramide **OR** metoCLOPramide (REGLAN) injection, ondansetron  **OR** ondansetron  (ZOFRAN ) IV, senna-docusate, traMADol  Physical Exam Pulmonary:     Effort: Pulmonary effort is normal.  Neurological:     Mental Status: She is alert.             Vital Signs: BP (!) 121/52 (BP Location: Right Arm)   Pulse 79   Temp 98.7 F (37.1 C)   Resp 16   Ht 5' 6 (1.676 m)   Wt 59.6 kg   SpO2 97%   BMI 21.21 kg/m  SpO2: SpO2: 97 % O2 Device: O2 Device: Room Air O2 Flow Rate: O2 Flow Rate (L/min): 3 L/min  Intake/output summary:  Intake/Output Summary (Last 24 hours) at 04/15/2024  1404 Last data filed at 04/15/2024 1047 Gross per 24 hour  Intake 120 ml  Output --  Net 120 ml   LBM: Last BM Date : 04/14/24 Baseline Weight: Weight: 59.6 kg Most recent weight: Weight: 59.6 kg    Patient Active Problem List   Diagnosis Date Noted   Facial laceration 04/14/2024   Protein-calorie malnutrition, severe 04/12/2024   Squamous cell carcinoma in situ 04/12/2024  Hip fracture due to osteoporosis (HCC) 04/11/2024   GERD (gastroesophageal reflux disease) 04/11/2024   Depression 04/11/2024   Acute blood loss anemia 10/07/2020   Chest pain with moderate risk for cardiac etiology 12/19/2015   Uncontrolled hypertension 12/19/2015   Anxiety 12/19/2015    Palliative Care Assessment & Plan   Recommendations/Plan: Recommend resuming home Lomotil for diarrhea, and stopping Senokot.   Code Status:    Code Status Orders  (From admission, onward)           Start     Ordered   04/11/24 0002  Full code  Continuous       Question:  By:  Answer:  Procedural case: previous code status reviewed   04/11/24 0003           Code Status History     Date Active Date Inactive Code Status Order ID Comments User Context   10/07/2020 2250 10/11/2020 1947 DNR [id number]  Lawence Madison LABOR, MD ED   10/07/2020 2100 10/07/2020 2250 Full Code [id number]  Mansy, Madison LABOR, MD ED   12/19/2015 0020 12/19/2015 1858 Full Code [id number]  Jenel Lenis, MD ED      Advance Directive Documentation    Flowsheet Row Most Recent Value  Type of Advance Directive Living will, Out of facility DNR (pink MOST or yellow form)  Pre-existing out of facility DNR order (yellow form or pink MOST form) --  MOST Form in Place? --    Camelia Lewis, NP  Please contact Palliative Medicine Team phone at [phone] for questions and concerns.

## 2024-04-16 NOTE — TOC Transition Note (Signed)
 Transition of Care [facility]) - Discharge Note   Patient Details  Name: Claudia Friedman MRN: [id number] Date of Birth: 06-27-1930  [facility]) CM/SW Contact:  Alvaro Louder, LCSW Phone Number: 04/16/2024, 10:56 AM   Clinical Narrative:     LCSWA confirmed with MD that patient is stable for discharge. LCSWA notified the patient and they are in agreement with discharge. LCSWA reached out to [facility] admissions coordinator and started service for patient. Transport arranged with [facility] for next available.   TOC to follow for discharge.           Patient Goals and CMS Choice            Discharge Placement                       Discharge Plan and Services Additional resources added to the After Visit Summary for                                       Social Drivers of Health (SDOH) Interventions SDOH Screenings   Food Insecurity: No Food Insecurity (04/11/2024)  Housing: Unknown (04/11/2024)  Transportation Needs: No Transportation Needs (04/11/2024)  Utilities: Not At Risk (04/11/2024)  Financial Resource Strain: Low Risk  (04/08/2023)   [facility]  Social Connections: Moderately Isolated (04/11/2024)  Tobacco Use: Low Risk  (04/11/2024)     Readmission Risk Interventions     No data to display

## 2024-05-07 ENCOUNTER — Other Ambulatory Visit: Payer: Self-pay

## 2024-05-07 ENCOUNTER — Emergency Department

## 2024-05-07 ENCOUNTER — Emergency Department
Admission: EM | Admit: 2024-05-07 | Discharge: 2024-05-07 | Disposition: A | Discharge status: Home / Self Care | Attending: Emergency Medicine | Admitting: Emergency Medicine

## 2024-05-07 DIAGNOSIS — I10 Essential (primary) hypertension: Secondary | ICD-10-CM | POA: Insufficient documentation

## 2024-05-07 DIAGNOSIS — K9423 Gastrostomy malfunction: Secondary | ICD-10-CM | POA: Insufficient documentation

## 2024-05-07 DIAGNOSIS — T85528A Displacement of other gastrointestinal prosthetic devices, implants and grafts, initial encounter: Secondary | ICD-10-CM

## 2024-05-07 MED ORDER — DIATRIZOATE MEGLUMINE & SODIUM 66-10 % PO SOLN
30.0000 mL | Freq: Once | ORAL | Status: AC
  Administered 2024-05-07: 30 mL

## 2024-05-07 NOTE — ED Provider Notes (Signed)
   [facility] Provider Note    Event Date/Time   First MD Initiated Contact with Patient 05/07/24 2005     (approximate)   History   Feeding tube problem   HPI  Claudia Friedman is a 89 y.o. female with history of hypertension and as listed in EMR presents to the emergency department for G-tube replacement.  She is not sure how but it came out around 6:30 PM today.  It was last changed about 2 months ago.     Physical Exam    Vitals:   05/07/24 1854  BP: 108/73  Pulse: 89  Resp: 17  Temp: 97.6 F (36.4 C)  SpO2: 92%    General: Awake, no distress.  CV:  Good peripheral perfusion.  Resp:  Normal effort.  Abd:  No distention.  Other:  G-tube site unremarkable   ED Results / Procedures / Treatments   Labs (all labs ordered are listed, but only abnormal results are displayed)  Labs Reviewed - No data to display   EKG  Not indicated   RADIOLOGY  Image and radiology report reviewed and interpreted by me. Radiology report consistent with the same.  DG abdomen confirms proper placement of G-tube.  PROCEDURES:  Critical Care performed: No  .Gastrostomy tube replacement  Date/Time: 05/07/2024 8:23 PM  Performed by: Herlinda Kirk NOVAK, FNP Authorized by: Herlinda Kirk NOVAK, FNP  Consent: Verbal consent obtained Consent given by: patient Patient understanding: patient states understanding of the procedure being performed Patient identity confirmed: verbally with patient Local anesthesia used: no  Anesthesia: Local anesthesia used: no Patient tolerance: patient tolerated the procedure well with no immediate complications      MEDICATIONS ORDERED IN ED:  Medications  diatrizoate  meglumine -sodium (GASTROGRAFIN ) 66-10 % solution 30 mL (30 mLs Per Tube Given 05/07/24 2043)     IMPRESSION / MDM / ASSESSMENT AND PLAN / ED COURSE   I have reviewed the triage note and vital signs. Vital signs are stable   Differential diagnosis  includes, but is not limited to, G-tube dislodged  Patient's presentation is most consistent with acute, uncomplicated illness.  89 year old female presenting to the emergency department for replacement of G-tube.  16 French G-tube inserted without difficulty.  X-ray with contrast to verify placement ordered.  Placement confirmed.  Patient discharged with instructions on outpatient follow-up and ER return precautions.      FINAL CLINICAL IMPRESSION(S) / ED DIAGNOSES   Final diagnoses:  Dislodged gastrostomy tube     Rx / DC Orders   ED Discharge Orders     None        Note:  This document was prepared using Dragon voice recognition software and may include unintentional dictation errors.   Herlinda Kirk NOVAK, FNP 05/07/24 2156    Bradler, Evan K, MD 05/07/24 2259

## 2024-05-07 NOTE — ED Notes (Signed)
 G-Tube replaced by Kirk, NP. Patient tolerated well, awaiting Xray to check placement.

## 2024-05-07 NOTE — ED Triage Notes (Signed)
 Pt comes in via pov with complaints of her feeding tube coming out. Pt states that she happened to look down and noticed it was out. Pt has no complaints of pain at this time.

## 2024-06-07 ENCOUNTER — Emergency Department
Admission: EM | Admit: 2024-06-07 | Discharge: 2024-06-07 | Disposition: A | Discharge status: Home / Self Care | Attending: Emergency Medicine | Admitting: Emergency Medicine

## 2024-06-07 ENCOUNTER — Other Ambulatory Visit: Payer: Self-pay

## 2024-06-07 DIAGNOSIS — I129 Hypertensive chronic kidney disease with stage 1 through stage 4 chronic kidney disease, or unspecified chronic kidney disease: Secondary | ICD-10-CM | POA: Insufficient documentation

## 2024-06-07 DIAGNOSIS — Z8522 Personal history of malignant neoplasm of nasal cavities, middle ear, and accessory sinuses: Secondary | ICD-10-CM | POA: Insufficient documentation

## 2024-06-07 DIAGNOSIS — Y732 Prosthetic and other implants, materials and accessory gastroenterology and urology devices associated with adverse incidents: Secondary | ICD-10-CM | POA: Insufficient documentation

## 2024-06-07 DIAGNOSIS — N189 Chronic kidney disease, unspecified: Secondary | ICD-10-CM | POA: Diagnosis not present

## 2024-06-07 DIAGNOSIS — T85848A Pain due to other internal prosthetic devices, implants and grafts, initial encounter: Secondary | ICD-10-CM | POA: Diagnosis present

## 2024-06-07 LAB — CBC
HCT: 42.4 % (ref 36.0–46.0)
Hemoglobin: 13.5 g/dL (ref 12.0–15.0)
MCH: 31 pg (ref 26.0–34.0)
MCHC: 31.8 g/dL (ref 30.0–36.0)
MCV: 97.2 fL (ref 80.0–100.0)
Platelets: 320 K/uL (ref 150–400)
RBC: 4.36 MIL/uL (ref 3.87–5.11)
RDW: 14.2 % (ref 11.5–15.5)
WBC: 8.9 K/uL (ref 4.0–10.5)
nRBC: 0 % (ref 0.0–0.2)

## 2024-06-07 LAB — BASIC METABOLIC PANEL WITH GFR
Anion gap: 19 — ABNORMAL HIGH (ref 5–15)
BUN: 21 mg/dL (ref 8–23)
CO2: 23 mmol/L (ref 22–32)
Calcium: 9.7 mg/dL (ref 8.9–10.3)
Chloride: 97 mmol/L — ABNORMAL LOW (ref 98–111)
Creatinine, Ser: 1.07 mg/dL — ABNORMAL HIGH (ref 0.44–1.00)
GFR, Estimated: 49 mL/min — ABNORMAL LOW
Glucose, Bld: 87 mg/dL (ref 70–99)
Potassium: 3.6 mmol/L (ref 3.5–5.1)
Sodium: 140 mmol/L (ref 135–145)

## 2024-06-07 NOTE — ED Triage Notes (Signed)
 Pt comes with g tube issue. Pt nurse came over today and noticed it was red around the site and sore to touch per pt.

## 2024-06-07 NOTE — ED Notes (Signed)
 Guaze dressing applied to gtube area.  Pt tolerated well.

## 2024-06-07 NOTE — ED Provider Notes (Signed)
 "  [facility] Provider Note    Event Date/Time   First MD Initiated Contact with Patient 06/07/24 2041     (approximate)   History   Chief Complaint g-tube issue   HPI  Claudia Friedman is a 89 y.o. 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  Patient's caregiver reports that she noticed earlier today that the skin was red and irritated around her current gastrostomy.  She was concerned that it could be infected but patient denies any pain around the tube site.  Patient has not been using the tube recently as she has been taking food and medication by mouth.  She has not noticed any drainage from the area and denies any fevers.     Physical Exam   Triage Vital Signs: ED Triage Vitals [06/07/24 1648]  Encounter Vitals Group     BP 110/69     Girls Systolic BP Percentile      Girls Diastolic BP Percentile      Boys Systolic BP Percentile      Boys Diastolic BP Percentile      Pulse Rate 91     Resp 18     Temp 97.8 F (36.6 C)     Temp src      SpO2 99 %     Weight      Height      Head Circumference      Peak Flow      Pain Score 4     Pain Loc      Pain Education      Exclude from Growth Chart     Most recent vital signs: Vitals:   06/07/24 1648 06/07/24 2156  BP: 110/69 108/79  Pulse: 91 88  Resp: 18 18  Temp: 97.8 F (36.6 C) 98 F (36.7 C)  SpO2: 99% 98%    Constitutional: Alert and oriented. Eyes: Conjunctivae are normal. Head: Atraumatic. Nose: No congestion/rhinnorhea. Mouth/Throat: Mucous membranes are moist.  Cardiovascular: Normal rate, regular rhythm. Grossly normal heart sounds.  2+ radial pulses bilaterally. Respiratory: Normal respiratory effort.  No retractions. Lungs CTAB. Gastrointestinal: Soft and nontender. No distention.  Gastrostomy site with some skin breakdown but no erythema, edema, warmth,  or tenderness. Musculoskeletal: No lower extremity tenderness nor edema.  Neurologic:  Normal speech and language. No gross focal neurologic deficits are appreciated.    ED Results / Procedures / Treatments   Labs (all labs ordered are listed, but only abnormal results are displayed) Labs Reviewed  BASIC METABOLIC PANEL WITH GFR - Abnormal; Notable for the following components:      Result Value   Chloride 97 (*)    Creatinine, Ser 1.07 (*)    GFR, Estimated 49 (*)    Anion gap 19 (*)    All other components within normal limits  CBC    PROCEDURES:  Critical Care performed: No  Procedures   MEDICATIONS ORDERED IN ED: Medications - No data to display   IMPRESSION / MDM / ASSESSMENT AND PLAN / ED COURSE  I reviewed the triage vital signs and the nursing notes.                              89 y.o. 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  Patient's presentation is most consistent with acute presentation with potential threat to life or bodily function.  Differential diagnosis includes, but is not limited to, cellulitis, abscess, pressure injury.  Patient well-appearing and in no acute distress, vital signs are unremarkable.  Labs are reassuring with stable CKD and no significant anemia, leukocytosis, or electrolyte abnormality.  The PEG tube site appears to have some skin breakdown from where the ring that helps retain the tube was putting pressure on her skin.  There are no signs of infection and dressing was changed to alleviate pressure on the skin of her abdomen.  Patient counseled to follow-up with her PCP for recheck and to return to the ED for new or worsening symptoms, patient agrees with plan.      FINAL CLINICAL IMPRESSION(S) / ED DIAGNOSES   Final diagnoses:  Pain around  percutaneous endoscopic gastrostomy (PEG) tube site, initial encounter     Rx / DC Orders   ED Discharge Orders     None        Note:  This document was prepared using Dragon voice recognition software and may include unintentional dictation errors.   Willo Dunnings, MD 06/07/24 2318  "
